# Patient Record
Sex: Female | Born: 1985 | Race: White | Hispanic: No | Marital: Married | Smoking: Never smoker
Health system: Southern US, Community
[De-identification: ages and names within clinical notes are randomized; demographics above are authoritative.]

## PROBLEM LIST (undated history)

## (undated) DIAGNOSIS — R112 Nausea with vomiting, unspecified: Secondary | ICD-10-CM

## (undated) DIAGNOSIS — R51 Headache: Secondary | ICD-10-CM

## (undated) DIAGNOSIS — R519 Headache, unspecified: Secondary | ICD-10-CM

## (undated) DIAGNOSIS — T7840XA Allergy, unspecified, initial encounter: Secondary | ICD-10-CM

## (undated) DIAGNOSIS — R002 Palpitations: Secondary | ICD-10-CM

## (undated) DIAGNOSIS — Z9889 Other specified postprocedural states: Secondary | ICD-10-CM

## (undated) DIAGNOSIS — C801 Malignant (primary) neoplasm, unspecified: Secondary | ICD-10-CM

## (undated) DIAGNOSIS — E041 Nontoxic single thyroid nodule: Secondary | ICD-10-CM

## (undated) HISTORY — DX: Nausea with vomiting, unspecified: Z98.890

## (undated) HISTORY — PX: OTHER SURGICAL HISTORY: SHX169

## (undated) HISTORY — DX: Malignant (primary) neoplasm, unspecified: C80.1

## (undated) HISTORY — PX: WISDOM TOOTH EXTRACTION: SHX21

## (undated) HISTORY — DX: Nausea with vomiting, unspecified: R11.2

## (undated) HISTORY — DX: Allergy, unspecified, initial encounter: T78.40XA

---

## 2014-05-05 NOTE — L&D Delivery Note (Signed)
Delivery Note At 3:31 PM a viable female was delivered via Vaginal, Spontaneous Delivery (Presentation: Right Occiput Anterior).  APGAR:8/9 , ; weight  .   Placenta status: Intact, Spontaneous.  Cord: 3 vessels with the following complications: None.  Cord pH: na  Anesthesia: Local  Episiotomy:second degree   Lacerations:  none Suture Repair: 2.0 chromic Est. Blood Loss (mL): 400    Mom to postpartum.  Baby to Couplet care / Skin to Skin.  Eman Morimoto S 01/18/2015, 3:52 PM

## 2014-05-17 ENCOUNTER — Other Ambulatory Visit (HOSPITAL_COMMUNITY): Payer: Self-pay | Admitting: Obstetrics and Gynecology

## 2014-05-17 DIAGNOSIS — E041 Nontoxic single thyroid nodule: Secondary | ICD-10-CM

## 2014-05-22 ENCOUNTER — Ambulatory Visit (HOSPITAL_COMMUNITY): Payer: Self-pay

## 2014-06-12 LAB — OB RESULTS CONSOLE ANTIBODY SCREEN: Antibody Screen: NEGATIVE

## 2014-06-12 LAB — OB RESULTS CONSOLE ABO/RH: RH TYPE: POSITIVE

## 2014-06-12 LAB — OB RESULTS CONSOLE HEPATITIS B SURFACE ANTIGEN: Hepatitis B Surface Ag: NEGATIVE

## 2014-06-12 LAB — OB RESULTS CONSOLE RUBELLA ANTIBODY, IGM: Rubella: IMMUNE

## 2014-06-12 LAB — OB RESULTS CONSOLE GC/CHLAMYDIA
Chlamydia: NEGATIVE
GC PROBE AMP, GENITAL: NEGATIVE

## 2014-06-12 LAB — OB RESULTS CONSOLE RPR: RPR: NONREACTIVE

## 2014-06-12 LAB — OB RESULTS CONSOLE HIV ANTIBODY (ROUTINE TESTING): HIV: NONREACTIVE

## 2014-07-11 ENCOUNTER — Other Ambulatory Visit (INDEPENDENT_AMBULATORY_CARE_PROVIDER_SITE_OTHER): Payer: Self-pay | Admitting: *Deleted

## 2014-07-11 DIAGNOSIS — E041 Nontoxic single thyroid nodule: Secondary | ICD-10-CM

## 2014-08-22 LAB — US OB FOLLOW UP

## 2014-08-23 ENCOUNTER — Other Ambulatory Visit: Payer: Self-pay | Admitting: Surgery

## 2014-08-23 DIAGNOSIS — E041 Nontoxic single thyroid nodule: Secondary | ICD-10-CM

## 2014-08-24 ENCOUNTER — Other Ambulatory Visit: Payer: Self-pay | Admitting: Surgery

## 2014-08-24 ENCOUNTER — Other Ambulatory Visit (HOSPITAL_COMMUNITY): Payer: Self-pay | Admitting: Obstetrics and Gynecology

## 2014-08-24 DIAGNOSIS — E041 Nontoxic single thyroid nodule: Secondary | ICD-10-CM

## 2014-08-24 DIAGNOSIS — O43122 Velamentous insertion of umbilical cord, second trimester: Secondary | ICD-10-CM

## 2014-08-24 DIAGNOSIS — Z3689 Encounter for other specified antenatal screening: Secondary | ICD-10-CM

## 2014-08-24 DIAGNOSIS — Z3A20 20 weeks gestation of pregnancy: Secondary | ICD-10-CM

## 2014-09-01 ENCOUNTER — Other Ambulatory Visit (HOSPITAL_COMMUNITY): Payer: BLUE CROSS/BLUE SHIELD

## 2014-09-01 ENCOUNTER — Encounter (HOSPITAL_COMMUNITY): Payer: Self-pay

## 2014-09-01 ENCOUNTER — Ambulatory Visit (HOSPITAL_COMMUNITY)
Admission: RE | Admit: 2014-09-01 | Discharge: 2014-09-01 | Disposition: A | Discharge status: Home / Self Care | Payer: BLUE CROSS/BLUE SHIELD | Source: Ambulatory Visit | Attending: Obstetrics and Gynecology | Admitting: Obstetrics and Gynecology

## 2014-09-01 DIAGNOSIS — Z3A20 20 weeks gestation of pregnancy: Secondary | ICD-10-CM | POA: Insufficient documentation

## 2014-09-01 DIAGNOSIS — IMO0002 Reserved for concepts with insufficient information to code with codable children: Secondary | ICD-10-CM | POA: Insufficient documentation

## 2014-09-01 DIAGNOSIS — Z3689 Encounter for other specified antenatal screening: Secondary | ICD-10-CM

## 2014-09-01 DIAGNOSIS — Z36 Encounter for antenatal screening of mother: Secondary | ICD-10-CM | POA: Diagnosis not present

## 2014-09-01 DIAGNOSIS — O43122 Velamentous insertion of umbilical cord, second trimester: Secondary | ICD-10-CM | POA: Insufficient documentation

## 2014-09-04 ENCOUNTER — Encounter (HOSPITAL_COMMUNITY): Payer: Self-pay | Admitting: Obstetrics and Gynecology

## 2014-09-04 ENCOUNTER — Other Ambulatory Visit (HOSPITAL_COMMUNITY): Payer: Self-pay | Admitting: Obstetrics and Gynecology

## 2014-11-14 ENCOUNTER — Other Ambulatory Visit: Payer: BLUE CROSS/BLUE SHIELD

## 2014-11-15 ENCOUNTER — Other Ambulatory Visit: Payer: Self-pay | Admitting: Surgery

## 2014-11-15 ENCOUNTER — Ambulatory Visit
Admission: RE | Admit: 2014-11-15 | Discharge: 2014-11-15 | Disposition: A | Discharge status: Home / Self Care | Payer: BLUE CROSS/BLUE SHIELD | Source: Ambulatory Visit | Attending: Surgery | Admitting: Surgery

## 2014-11-15 DIAGNOSIS — E041 Nontoxic single thyroid nodule: Secondary | ICD-10-CM

## 2015-01-18 ENCOUNTER — Inpatient Hospital Stay (HOSPITAL_COMMUNITY)
Admission: AD | Admit: 2015-01-18 | Discharge: 2015-01-20 | DRG: 775 | Disposition: A | Discharge status: Home / Self Care | Payer: BC Managed Care – PPO | Source: Ambulatory Visit | Attending: Obstetrics and Gynecology | Admitting: Obstetrics and Gynecology

## 2015-01-18 ENCOUNTER — Encounter (HOSPITAL_COMMUNITY): Payer: Self-pay | Admitting: *Deleted

## 2015-01-18 DIAGNOSIS — E041 Nontoxic single thyroid nodule: Secondary | ICD-10-CM | POA: Diagnosis present

## 2015-01-18 DIAGNOSIS — Z3A40 40 weeks gestation of pregnancy: Secondary | ICD-10-CM | POA: Diagnosis present

## 2015-01-18 DIAGNOSIS — O99284 Endocrine, nutritional and metabolic diseases complicating childbirth: Secondary | ICD-10-CM | POA: Diagnosis present

## 2015-01-18 DIAGNOSIS — O480 Post-term pregnancy: Secondary | ICD-10-CM | POA: Diagnosis present

## 2015-01-18 DIAGNOSIS — Z3689 Encounter for other specified antenatal screening: Secondary | ICD-10-CM

## 2015-01-18 DIAGNOSIS — O43123 Velamentous insertion of umbilical cord, third trimester: Secondary | ICD-10-CM | POA: Diagnosis present

## 2015-01-18 DIAGNOSIS — Z3490 Encounter for supervision of normal pregnancy, unspecified, unspecified trimester: Secondary | ICD-10-CM

## 2015-01-18 DIAGNOSIS — IMO0002 Reserved for concepts with insufficient information to code with codable children: Secondary | ICD-10-CM

## 2015-01-18 DIAGNOSIS — Z3A20 20 weeks gestation of pregnancy: Secondary | ICD-10-CM

## 2015-01-18 LAB — CBC
HEMATOCRIT: 34 % — AB (ref 36.0–46.0)
HEMOGLOBIN: 11.7 g/dL — AB (ref 12.0–15.0)
MCH: 30.9 pg (ref 26.0–34.0)
MCHC: 34.4 g/dL (ref 30.0–36.0)
MCV: 89.7 fL (ref 78.0–100.0)
Platelets: 207 10*3/uL (ref 150–400)
RBC: 3.79 MIL/uL — ABNORMAL LOW (ref 3.87–5.11)
RDW: 14.4 % (ref 11.5–15.5)
WBC: 11.3 10*3/uL — AB (ref 4.0–10.5)

## 2015-01-18 LAB — ABO/RH: ABO/RH(D): O POS

## 2015-01-18 LAB — TYPE AND SCREEN
ABO/RH(D): O POS
ANTIBODY SCREEN: NEGATIVE

## 2015-01-18 LAB — RPR: RPR Ser Ql: NONREACTIVE

## 2015-01-18 MED ORDER — ACETAMINOPHEN 325 MG PO TABS: 650.0000 mg | ORAL_TABLET | ORAL | Status: DC | PRN

## 2015-01-18 MED ORDER — BENZOCAINE-MENTHOL 20-0.5 % EX AERO
1.0000 "application " | INHALATION_SPRAY | CUTANEOUS | Status: DC | PRN
  Administered 2015-01-18: 1 via TOPICAL
  Filled 2015-01-18: qty 56

## 2015-01-18 MED ORDER — FENTANYL 2.5 MCG/ML BUPIVACAINE 1/10 % EPIDURAL INFUSION (WH - ANES)
14.0000 mL/h | INTRAMUSCULAR | Status: DC | PRN
Start: 2015-01-18 — End: 2015-01-18

## 2015-01-18 MED ORDER — LANOLIN HYDROUS EX OINT: TOPICAL_OINTMENT | CUTANEOUS | Status: DC | PRN

## 2015-01-18 MED ORDER — TETANUS-DIPHTH-ACELL PERTUSSIS 5-2.5-18.5 LF-MCG/0.5 IM SUSP: 0.5000 mL | Freq: Once | INTRAMUSCULAR | Status: DC

## 2015-01-18 MED ORDER — OXYCODONE-ACETAMINOPHEN 5-325 MG PO TABS
2.0000 | ORAL_TABLET | ORAL | Status: DC | PRN
Start: 2015-01-18 — End: 2015-01-18

## 2015-01-18 MED ORDER — OXYCODONE-ACETAMINOPHEN 5-325 MG PO TABS: 1.0000 | ORAL_TABLET | ORAL | Status: DC | PRN

## 2015-01-18 MED ORDER — DIPHENHYDRAMINE HCL 50 MG/ML IJ SOLN: 12.5000 mg | INTRAMUSCULAR | Status: DC | PRN

## 2015-01-18 MED ORDER — ZOLPIDEM TARTRATE 5 MG PO TABS: 5.0000 mg | ORAL_TABLET | Freq: Every evening | ORAL | Status: DC | PRN

## 2015-01-18 MED ORDER — ONDANSETRON HCL 4 MG/2ML IJ SOLN: 4.0000 mg | INTRAMUSCULAR | Status: DC | PRN

## 2015-01-18 MED ORDER — BISACODYL 10 MG RE SUPP: 10.0000 mg | Freq: Every day | RECTAL | Status: DC | PRN

## 2015-01-18 MED ORDER — DIPHENHYDRAMINE HCL 25 MG PO CAPS: 25.0000 mg | ORAL_CAPSULE | Freq: Four times a day (QID) | ORAL | Status: DC | PRN

## 2015-01-18 MED ORDER — DIBUCAINE 1 % RE OINT: 1.0000 "application " | TOPICAL_OINTMENT | RECTAL | Status: DC | PRN

## 2015-01-18 MED ORDER — FLEET ENEMA 7-19 GM/118ML RE ENEM: 1.0000 | ENEMA | Freq: Every day | RECTAL | Status: DC | PRN

## 2015-01-18 MED ORDER — LIDOCAINE HCL (PF) 1 % IJ SOLN
30.0000 mL | INTRAMUSCULAR | Status: AC | PRN
  Administered 2015-01-18: 30 mL via SUBCUTANEOUS
  Filled 2015-01-18: qty 30

## 2015-01-18 MED ORDER — WITCH HAZEL-GLYCERIN EX PADS: 1.0000 "application " | MEDICATED_PAD | CUTANEOUS | Status: DC | PRN

## 2015-01-18 MED ORDER — SIMETHICONE 80 MG PO CHEW: 80.0000 mg | CHEWABLE_TABLET | ORAL | Status: DC | PRN

## 2015-01-18 MED ORDER — OXYCODONE-ACETAMINOPHEN 5-325 MG PO TABS: 2.0000 | ORAL_TABLET | ORAL | Status: DC | PRN

## 2015-01-18 MED ORDER — OXYTOCIN 40 UNITS IN LACTATED RINGERS INFUSION - SIMPLE MED
62.5000 mL/h | INTRAVENOUS | Status: DC
  Filled 2015-01-18: qty 1000

## 2015-01-18 MED ORDER — LACTATED RINGERS IV SOLN: 500.0000 mL | INTRAVENOUS | Status: DC | PRN

## 2015-01-18 MED ORDER — EPHEDRINE 5 MG/ML INJ
10.0000 mg | INTRAVENOUS | Status: DC | PRN
Start: 2015-01-18 — End: 2015-01-18
  Filled 2015-01-18: qty 2

## 2015-01-18 MED ORDER — SENNOSIDES-DOCUSATE SODIUM 8.6-50 MG PO TABS
2.0000 | ORAL_TABLET | ORAL | Status: DC
  Administered 2015-01-18 – 2015-01-20 (×2): 2 via ORAL
  Filled 2015-01-18 (×2): qty 2

## 2015-01-18 MED ORDER — IBUPROFEN 600 MG PO TABS
600.0000 mg | ORAL_TABLET | Freq: Four times a day (QID) | ORAL | Status: DC
  Administered 2015-01-18 – 2015-01-20 (×8): 600 mg via ORAL
  Filled 2015-01-18 (×8): qty 1

## 2015-01-18 MED ORDER — CITRIC ACID-SODIUM CITRATE 334-500 MG/5ML PO SOLN: 30.0000 mL | ORAL | Status: DC | PRN

## 2015-01-18 MED ORDER — FLEET ENEMA 7-19 GM/118ML RE ENEM: 1.0000 | ENEMA | RECTAL | Status: DC | PRN

## 2015-01-18 MED ORDER — LACTATED RINGERS IV SOLN: INTRAVENOUS | Status: DC

## 2015-01-18 MED ORDER — PRENATAL MULTIVITAMIN CH
1.0000 | ORAL_TABLET | Freq: Every day | ORAL | Status: DC
  Administered 2015-01-19 – 2015-01-20 (×2): 1 via ORAL
  Filled 2015-01-18 (×2): qty 1

## 2015-01-18 MED ORDER — ONDANSETRON HCL 4 MG/2ML IJ SOLN: 4.0000 mg | Freq: Four times a day (QID) | INTRAMUSCULAR | Status: DC | PRN

## 2015-01-18 MED ORDER — OXYTOCIN BOLUS FROM INFUSION
500.0000 mL | INTRAVENOUS | Status: DC
  Administered 2015-01-18: 500 mL via INTRAVENOUS

## 2015-01-18 MED ORDER — PHENYLEPHRINE 40 MCG/ML (10ML) SYRINGE FOR IV PUSH (FOR BLOOD PRESSURE SUPPORT)
80.0000 ug | PREFILLED_SYRINGE | INTRAVENOUS | Status: DC | PRN
  Filled 2015-01-18: qty 2

## 2015-01-18 MED ORDER — ONDANSETRON HCL 4 MG PO TABS: 4.0000 mg | ORAL_TABLET | ORAL | Status: DC | PRN

## 2015-01-18 NOTE — Progress Notes (Signed)
Patient ID: Crystal Humphrey, female   DOB: 03/02/1986, 29 y.o.   MRN: [id number] Did have a  Velamentous insertion of the cord   Sent to path

## 2015-01-18 NOTE — Progress Notes (Signed)
Pt to RM 164

## 2015-01-18 NOTE — MAU Note (Signed)
Pt reports contractions and some bleeding.

## 2015-01-18 NOTE — H&P (Signed)
Ethelene Schindel is a 29 y.o. female presenting AT [place].  NEG GBS. Maternal Medical History:  Reason for admission: Contractions.   Contractions: Onset was 3-5 hours ago.   Frequency: regular.   Perceived severity is moderate.    Fetal activity: Perceived fetal activity is normal.    Prenatal complications: THYROID NODULE AND VELAMENTOUS INSERTION OF CORD  Prenatal Complications - Diabetes: none.    OB History    Gravida Para Term Preterm AB TAB SAB Ectopic Multiple Living   1              Past Medical History  Diagnosis Date  . Medical history non-contributory    Past Surgical History  Procedure Laterality Date  . Biopsy on thyroid     Family History: family history is not on file. Social History:  reports that she has never smoked. She does not have any smokeless tobacco history on file. She reports that she does not drink alcohol or use illicit drugs.   Prenatal Transfer Tool  Maternal Diabetes: No Genetic Screening: Normal Maternal Ultrasounds/Referrals: Normal Fetal Ultrasounds or other Referrals:  None Maternal Substance Abuse:  No Significant Maternal Medications:  None Significant Maternal Lab Results:  None Other Comments:  None  ROS  Dilation: 4 Effacement (%): 90 Station: -1 Exam by:: soliz rn  Blood pressure 128/84, pulse 57, temperature 98.2 F (36.8 C), temperature source Oral, resp. rate 18, height 5\' 5"  (1.651 m), weight 174 lb (78.926 kg), SpO2 100 %. Maternal Exam:  Uterine Assessment: Contraction strength is firm.  Contraction frequency is regular.   Abdomen: Estimated fetal weight is 7+.   Fetal presentation: vertex  Pelvis: adequate for delivery.   Cervix: 5 CM AND 90 %  Fetal Exam Fetal State Assessment: Category I - tracings are normal.     Physical Exam  Prenatal labs: ABO, Rh: O/Positive/-- (02/08 0000) Antibody: Negative (02/08 0000) Rubella: Immune (02/08 0000) RPR: Nonreactive (02/08 0000)  HBsAg:  Negative (02/08 0000)  HIV: Non-reactive (02/08 0000)  GBS:     Assessment/Plan: IUP AT TERM WITH SOL ROUTINE L AND  D   Helmut Hennon S 01/18/2015, 8:32 AM

## 2015-01-18 NOTE — Progress Notes (Signed)
Notified of pt arrival in MAU and vaginal exam. Will let patient walk and recheck cervix after 1 hour

## 2015-01-19 LAB — CBC
HEMATOCRIT: 32.6 % — AB (ref 36.0–46.0)
Hemoglobin: 11 g/dL — ABNORMAL LOW (ref 12.0–15.0)
MCH: 30.5 pg (ref 26.0–34.0)
MCHC: 33.7 g/dL (ref 30.0–36.0)
MCV: 90.3 fL (ref 78.0–100.0)
Platelets: 215 10*3/uL (ref 150–400)
RBC: 3.61 MIL/uL — ABNORMAL LOW (ref 3.87–5.11)
RDW: 14.7 % (ref 11.5–15.5)
WBC: 15.9 10*3/uL — ABNORMAL HIGH (ref 4.0–10.5)

## 2015-01-19 NOTE — Progress Notes (Signed)
Post Partum Day 1 Subjective: no complaints, up ad lib, voiding, tolerating PO and + flatus  Objective: Blood pressure 132/87, pulse 86, temperature 98.4 F (36.9 C), temperature source Oral, resp. rate 20, height 5\' 5"  (1.651 m), weight 174 lb (78.926 kg), SpO2 100 %, unknown if currently breastfeeding.  Physical Exam:  General: alert and cooperative Lochia: appropriate Uterine Fundus: firm Incision: healing well DVT Evaluation: No evidence of DVT seen on physical exam. Negative Homan's sign. No cords or calf tenderness. No significant calf/ankle edema.   Recent Labs  01/18/15 0735 01/19/15 0545  HGB 11.7* 11.0*  HCT 34.0* 32.6*    Assessment/Plan: Plan for discharge tomorrow and Circumcision prior to discharge   LOS: 1 day   CURTIS,CAROL G 01/19/2015, 8:07 AM

## 2015-01-19 NOTE — Lactation Note (Signed)
This note was copied from the chart of [place]. Lactation Consultation Note New mom stated baby has had 2 good BF, but hasn't been interested recently. Has been spitting.  Educated about newborn behavior, I&O, supply and demand. Mom encouraged to do skin-to-skin. Mom encouraged to feed baby 8-12 times/24 hours and with feeding cues. Mom encouraged to waken baby for feeds. Hand expression taught to Mom w/noted colostrum. Referred to Baby and Me Book in Breastfeeding section Pg. 22-23 for position options and Proper latch demonstration. Mom has small breast feel slightly heavy. Everted nipples w/short shaft, able to obtain a deep latch. Baby has recessed chin and will need chin tug, mom demonstrated after taught. Explained baby being spitty and not hungry, encouraged to keep doing STS and trying to BF.  [place] brochure given w/resources, support groups and [place] services. Patient Name: Crystal Humphrey NOMVE'H Date: 01/19/2015 Reason for consult: Initial assessment   Maternal Data Has patient been taught Hand Expression?: Yes Does the patient have breastfeeding experience prior to this delivery?: No  Feeding Feeding Type: Breast Fed Length of feed: 0 min  LATCH Score/Interventions Latch: Too sleepy or reluctant, no latch achieved, no sucking elicited. Intervention(s): Skin to skin;Teach feeding cues;Waking techniques Intervention(s): Adjust position;Assist with latch;Breast massage;Breast compression  Audible Swallowing: None Intervention(s): Skin to skin;Hand expression  Type of Nipple: Everted at rest and after stimulation (short shaft)  Comfort (Breast/Nipple): Soft / non-tender     Hold (Positioning): Assistance needed to correctly position infant at breast and maintain latch. Intervention(s): Skin to skin;Position options;Support Pillows;Breastfeeding basics reviewed  LATCH Score: 5  Lactation Tools Discussed/Used WIC Program: No   Consult Status Consult  Status: Follow-up Date: 01/19/15 Follow-up type: In-patient    Theodoro Kalata 01/19/2015, 4:51 AM

## 2015-01-19 NOTE — Lactation Note (Addendum)
This note was copied from the chart of [place]. Lactation Consultation Note  Assisted mom with deeper latch.  Many swallows observed.  Mom reported increased comfort and less dimpling.  Parents reported that he was much more content when he was detached from the first breast than in previous feedings. Mom has small breasts but glandular tissue is palpable.  Follow-up tomorrow.  Patient Name: Crystal Humphrey BHALP'F Date: 01/19/2015     Maternal Data    Feeding Feeding Type: Breast Fed Length of feed: 35 min  LATCH Score/Interventions Latch: Grasps breast easily, tongue down, lips flanged, rhythmical sucking.  Audible Swallowing: A few with stimulation  Type of Nipple: Everted at rest and after stimulation  Comfort (Breast/Nipple): Soft / non-tender     Hold (Positioning): No assistance needed to correctly position infant at breast.  LATCH Score: 9  Lactation Tools Discussed/Used     Consult Status Consult Status: Follow-up Date: 01/20/15 Follow-up type: In-patient    Van Clines 01/19/2015, 11:03 PM

## 2015-01-20 NOTE — Lactation Note (Signed)
This note was copied from the chart of [place]. Lactation Consultation Note; Mom has baby latched to the breast when I was called to room. Nursing in cross cradle position. Baby has bottom lip tucked under, adjusted bottom lip and mom reports that feels a little better. Reviewed with dad how to untuck bottom lip. No further questions at present. To call prn  Patient Name: Crystal Humphrey KGURK'Y Date: 01/20/2015 Reason for consult: Follow-up assessment   Maternal Data Formula Feeding for Exclusion: No Does the patient have breastfeeding experience prior to this delivery?: No  Feeding Feeding Type: Breast Fed Length of feed: 10 min  LATCH Score/Interventions Latch: Grasps breast easily, tongue down, lips flanged, rhythmical sucking.  Audible Swallowing: A few with stimulation  Type of Nipple: Everted at rest and after stimulation  Comfort (Breast/Nipple): Filling, red/small blisters or bruises, mild/mod discomfort  Problem noted: Mild/Moderate discomfort Interventions (Mild/moderate discomfort): Comfort gels;Hand expression  Hold (Positioning): No assistance needed to correctly position infant at breast. Intervention(s): Breastfeeding basics reviewed  LATCH Score: 8  Lactation Tools Discussed/Used WIC Program: No Pump Review: Setup, frequency, and cleaning Initiated by:: DW Date initiated:: 01/20/15   Consult Status Consult Status: Complete Date: 01/20/15 Follow-up type: In-patient    Truddie Crumble 01/20/2015, 12:55 PM

## 2015-01-20 NOTE — Lactation Note (Signed)
This note was copied from the chart of [place]. Lactation Consultation Note  Follow up with mom at request of Dr. [place] and prior to D/C. Mom reports that infant was sleepy yesterday but cluster fed during the night. She reports that she does have difficulty with getting him latched at times and has to stimulate to keep awake at breast. Mom reported that there is pain throughout the feeds at times but she just leaves him as she is glad he is eating. Explained that pain during feeds generally indicates improper latch and the importance to relatch as necessary for deep latch to decrease trauma to nipple tissue and maximize milk transfer. Baby is out of the room for circumcision, left mom LC number to call with next feeding. Baby has Ped. appt tomorrow. Mom is able to get a DEBP from her insurance company and plans to order that when home. Will send home with hand pump. Mom is able to hand express milk. Told to call for next feeding for assessment.  Patient Name: Boy Takyra Cantrall ASTMH'D Date: 01/20/2015 Reason for consult: Follow-up assessment   Maternal Data    Feeding    LATCH Score/Interventions                      Lactation Tools Discussed/Used     Consult Status Consult Status: Follow-up Date: 01/20/15 Follow-up type: In-patient    Debby Freiberg Hice 01/20/2015, 9:11 AM

## 2015-01-20 NOTE — Lactation Note (Signed)
This note was copied from the chart of [place]. Lactation Consultation Note  Baby just back from circ. Awake and rooting. Assisted mom with latch in football position  Reviewed wide open mouth and keeping the baby close to the breast throughout the feeding. Encouraged to hold breast for good support. She is letting go of the breast and baby sliding to the tip. Comfort gels given with instructions for use. Plans to get pump from insurance co. Manual pump given- reviewed engorgement prevention and treatment. No further questions at present. Reviewed BFSG and OP appointments as resources for support after DC. To call prn  Patient Name: Crystal Humphrey DHWYS'H Date: 01/20/2015 Reason for consult: Follow-up assessment   Maternal Data Formula Feeding for Exclusion: No Does the patient have breastfeeding experience prior to this delivery?: No  Feeding Feeding Type: Breast Fed Length of feed: 10 min  LATCH Score/Interventions Latch: Grasps breast easily, tongue down, lips flanged, rhythmical sucking.  Audible Swallowing: A few with stimulation  Type of Nipple: Everted at rest and after stimulation  Comfort (Breast/Nipple): Soft / non-tender     Hold (Positioning): Assistance needed to correctly position infant at breast and maintain latch. Intervention(s): Breastfeeding basics reviewed  LATCH Score: 8  Lactation Tools Discussed/Used WIC Program: No Pump Review: Setup, frequency, and cleaning Initiated by:: DW Date initiated:: 01/20/15   Consult Status Consult Status: Complete Date: 01/20/15 Follow-up type: In-patient    Truddie Crumble 01/20/2015, 11:52 AM

## 2015-01-20 NOTE — Discharge Summary (Signed)
Obstetric Discharge Summary Reason for Admission: onset of labor Prenatal Procedures: none Intrapartum Procedures: spontaneous vaginal delivery Postpartum Procedures: none Complications-Operative and Postpartum: 2nd degree perineal laceration HEMOGLOBIN  Date Value Ref Range Status  01/19/2015 11.0* 12.0 - 15.0 g/dL Final   HCT  Date Value Ref Range Status  01/19/2015 32.6* 36.0 - 46.0 % Final    Physical Exam:  General: alert, cooperative and appears stated age 29: appropriate Uterine Fundus: firm Incision: healing well, no significant drainage, no dehiscence DVT Evaluation: No evidence of DVT seen on physical exam.  Discharge Diagnoses: Term Pregnancy-delivered  Discharge Information: Date: 01/20/2015 Activity: pelvic rest Diet: routine Medications: None Condition: stable Instructions: refer to practice specific booklet Discharge to: home   Newborn Data: Live born female  Birth Weight: 6 lb 11.6 oz (3050 g) APGAR: 9, 9  Home with mother.  Crystal Humphrey 01/20/2015, 8:01 AM

## 2015-01-23 ENCOUNTER — Inpatient Hospital Stay (HOSPITAL_COMMUNITY): Payer: BLUE CROSS/BLUE SHIELD

## 2015-12-18 ENCOUNTER — Other Ambulatory Visit: Payer: Self-pay | Admitting: Surgery

## 2015-12-18 DIAGNOSIS — E041 Nontoxic single thyroid nodule: Secondary | ICD-10-CM

## 2015-12-19 ENCOUNTER — Ambulatory Visit
Admission: RE | Admit: 2015-12-19 | Discharge: 2015-12-19 | Disposition: A | Discharge status: Home / Self Care | Payer: BC Managed Care – PPO | Source: Ambulatory Visit | Attending: Surgery | Admitting: Surgery

## 2015-12-19 DIAGNOSIS — E041 Nontoxic single thyroid nodule: Secondary | ICD-10-CM

## 2015-12-21 ENCOUNTER — Ambulatory Visit: Payer: Self-pay | Admitting: Surgery

## 2016-02-28 NOTE — Patient Instructions (Addendum)
Crystal Humphrey  02/28/2016   Your procedure is scheduled on: 03-07-16  Report to [facility] Main  Entrance take [facility]  elevators to 3rd floor to  [place] at 715 AM.  Call this number if you have problems the morning of surgery [phone]   Remember: ONLY 1 PERSON MAY GO WITH YOU TO SHORT STAY TO GET  READY MORNING OF [place].  Do not eat food or drink liquids :After Midnight.     Take these medicines the morning of surgery with A SIP OF WATER:NONE              You may not have any metal on your body including hair pins and              piercings  Do not wear jewelry, make-up, lotions, powders or perfumes, deodorant             Do not wear nail polish.  Do not shave  48 hours prior to surgery.              Men may shave face and neck.   Do not bring valuables to the hospital. [place].  Contacts, dentures or bridgework may not be worn into surgery.  Leave suitcase in the car. After surgery it may be brought to your room.     Patients discharged the day of surgery will not be allowed to drive home.  Name and phone number of your driver: Crystal Humphrey CELL [phone]  Special Instructions: N/A              Please read over the following fact sheets you were given: _____________________________________________________________________             [facility] - Preparing for Surgery Before surgery, you can play an important role.  Because skin is not sterile, your skin needs to be as free of germs as possible.  You can reduce the number of germs on your skin by washing with CHG (chlorahexidine gluconate) soap before surgery.  CHG is an antiseptic cleaner which kills germs and bonds with the skin to continue killing germs even after washing. Please DO NOT use if you have an allergy to CHG or antibacterial soaps.  If your skin becomes reddened/irritated stop using the CHG and inform your nurse  when you arrive at Short Stay. Do not shave (including legs and underarms) for at least 48 hours prior to the first CHG shower.  You may shave your face/neck. Please follow these instructions carefully:  1.  Shower with CHG Soap the night before surgery and the  morning of Surgery.  2.  If you choose to wash your hair, wash your hair first as usual with your  normal  shampoo.  3.  After you shampoo, rinse your hair and body thoroughly to remove the  shampoo.                           4.  Use CHG as you would any other liquid soap.  You can apply chg directly  to the skin and wash                       Gently with a scrungie or  clean washcloth.  5.  Apply the CHG Soap to your body ONLY FROM THE NECK DOWN.   Do not use on face/ open                           Wound or open sores. Avoid contact with eyes, ears mouth and genitals (private parts).                       Wash face,  Genitals (private parts) with your normal soap.             6.  Wash thoroughly, paying special attention to the area where your surgery  will be performed.  7.  Thoroughly rinse your body with warm water from the neck down.  8.  DO NOT shower/wash with your normal soap after using and rinsing off  the CHG Soap.                9.  Pat yourself dry with a clean towel.            10.  Wear clean pajamas.            11.  Place clean sheets on your bed the night of your first shower and do not  sleep with pets. Day of Surgery : Do not apply any lotions/deodorants the morning of surgery.  Please wear clean clothes to the hospital/surgery center.  FAILURE TO FOLLOW THESE INSTRUCTIONS MAY RESULT IN THE CANCELLATION OF YOUR SURGERY PATIENT SIGNATURE_________________________________  NURSE SIGNATURE__________________________________  ________________________________________________________________________

## 2016-02-29 ENCOUNTER — Ambulatory Visit (HOSPITAL_COMMUNITY)
Admission: RE | Admit: 2016-02-29 | Discharge: 2016-02-29 | Disposition: A | Discharge status: Home / Self Care | Payer: BC Managed Care – PPO | Source: Ambulatory Visit | Attending: Anesthesiology | Admitting: Anesthesiology

## 2016-02-29 ENCOUNTER — Encounter (HOSPITAL_COMMUNITY): Payer: Self-pay | Admitting: *Deleted

## 2016-02-29 ENCOUNTER — Encounter (HOSPITAL_COMMUNITY)
Admission: RE | Admit: 2016-02-29 | Discharge: 2016-02-29 | Disposition: A | Discharge status: Home / Self Care | Payer: BC Managed Care – PPO | Source: Ambulatory Visit | Attending: Surgery | Admitting: Surgery

## 2016-02-29 DIAGNOSIS — Z01818 Encounter for other preprocedural examination: Secondary | ICD-10-CM

## 2016-02-29 DIAGNOSIS — Z01812 Encounter for preprocedural laboratory examination: Secondary | ICD-10-CM | POA: Insufficient documentation

## 2016-02-29 DIAGNOSIS — E041 Nontoxic single thyroid nodule: Secondary | ICD-10-CM | POA: Insufficient documentation

## 2016-02-29 DIAGNOSIS — Z01810 Encounter for preprocedural cardiovascular examination: Secondary | ICD-10-CM | POA: Diagnosis present

## 2016-02-29 HISTORY — DX: Nontoxic single thyroid nodule: E04.1

## 2016-02-29 LAB — CBC
HEMATOCRIT: 39.2 % (ref 36.0–46.0)
HEMOGLOBIN: 13.1 g/dL (ref 12.0–15.0)
MCH: 29.9 pg (ref 26.0–34.0)
MCHC: 33.4 g/dL (ref 30.0–36.0)
MCV: 89.5 fL (ref 78.0–100.0)
Platelets: 258 10*3/uL (ref 150–400)
RBC: 4.38 MIL/uL (ref 3.87–5.11)
RDW: 12.5 % (ref 11.5–15.5)
WBC: 5.2 10*3/uL (ref 4.0–10.5)

## 2016-02-29 LAB — HCG, SERUM, QUALITATIVE: Preg, Serum: NEGATIVE

## 2016-03-06 ENCOUNTER — Encounter (HOSPITAL_COMMUNITY): Payer: Self-pay | Admitting: Surgery

## 2016-03-06 DIAGNOSIS — E041 Nontoxic single thyroid nodule: Secondary | ICD-10-CM | POA: Diagnosis present

## 2016-03-06 NOTE — H&P (Signed)
General Surgery [facility]  Crystal Humphrey DOB: 09-20-1985 Married / Language: English / Race: White Female  History of Present Illness  Patient words: thyroid.  The patient is a 30 year old female who presents with a thyroid nodule.  Patient returns for follow-up of thyroid nodule involving the thyroid isthmus. This nodule has been followed with sequential ultrasound scanning and has waxed and waned in size. Compared to one year ago, there has been mild enlargement in the nodule now measures 1.9 x 0.8 x 1.7 cm. It is mixed cystic and solid in composition and is wider than tall in shape. Biopsy has been recommended by the radiologist. Patient denies any new findings in the neck.   Allergies  No Known Drug Allergies03/11/2014  Medication History ZyrTEC Allergy (10MG  Capsule, Oral) Active. Multivitamin Adult (Oral) Active. Medications Reconciled  Vitals Weight: 135.6 lb Height: 65in Body Surface Area: 1.68 m Body Mass Index: 22.56 kg/m  Temp.: 98.61F(Oral)  Pulse: 68 (Regular)  BP: 104/62 (Sitting, Left Arm, Standard)  Physical Exam The physical exam findings are as follows: Note:General - appears comfortable, no distress; not diaphorectic  HEENT - normocephalic; sclerae clear, gaze conjugate; mucous membranes moist, dentition good; voice normal  Neck - symmetric on extension; no palpable anterior or posterior cervical adenopathy; in the midline overlying the trachea is a soft tissue mass measuring at least 2 cm in diameter, slightly firm, mobile, discrete, nontender; left and right thyroid lobes are without palpable abnormality  Chest - clear bilaterally without rhonchi, rales, or wheeze  Cor - regular rhythm with normal rate; no significant murmur  Ext - non-tender without significant edema or lymphedema  Neuro - grossly intact; no tremor   Assessment & Plan  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 Remainder of the thyroid appears normal on ultrasound examination and clinical examination. The dominant nodule has increased in size on sequential ultrasound scanning. Biopsy has been recommended by the radiologist.  Patient is a good candidate for isthmusectomy. This would remove the entire nodule and allow for definitive pathologic evaluation. The remainder of the thyroid appears grossly normal. I think this could be done as an outpatient surgical procedure.  Risk and benefits of the procedure discussed with patient. We discussed time out of work. We discussed the location of the surgical incision. We discussed the possibility of additional surgery depending on the final diagnosis. Patient and her family understand and wish to proceed with surgery in the near future.  The risks and benefits of the procedure have been discussed at length with the patient. The patient understands the proposed procedure, potential alternative treatments, and the course of recovery to be expected. All of the patient's questions have been answered at this time. The patient wishes to proceed with surgery.  Earnstine Regal, MD, [facility] Office: [phone]

## 2016-03-07 ENCOUNTER — Encounter (HOSPITAL_COMMUNITY)
Admission: RE | Disposition: A | Discharge status: Home / Self Care | Payer: Self-pay | Source: Ambulatory Visit | Attending: Surgery

## 2016-03-07 ENCOUNTER — Ambulatory Visit (HOSPITAL_COMMUNITY)
Admission: RE | Admit: 2016-03-07 | Discharge: 2016-03-07 | Disposition: A | Discharge status: Home / Self Care | Payer: BC Managed Care – PPO | Source: Ambulatory Visit | Attending: Surgery | Admitting: Surgery

## 2016-03-07 ENCOUNTER — Encounter (HOSPITAL_COMMUNITY): Payer: Self-pay | Admitting: *Deleted

## 2016-03-07 ENCOUNTER — Ambulatory Visit (HOSPITAL_COMMUNITY): Payer: BC Managed Care – PPO | Admitting: Anesthesiology

## 2016-03-07 DIAGNOSIS — C73 Malignant neoplasm of thyroid gland: Secondary | ICD-10-CM | POA: Diagnosis not present

## 2016-03-07 DIAGNOSIS — E041 Nontoxic single thyroid nodule: Secondary | ICD-10-CM

## 2016-03-07 DIAGNOSIS — Z79899 Other long term (current) drug therapy: Secondary | ICD-10-CM | POA: Insufficient documentation

## 2016-03-07 HISTORY — PX: THYROIDECTOMY: SHX17

## 2016-03-07 SURGERY — THYROIDECTOMY
Anesthesia: General | Site: Neck

## 2016-03-07 MED ORDER — BUPIVACAINE HCL (PF) 0.5 % IJ SOLN
INTRAMUSCULAR | Status: AC
  Filled 2016-03-07: qty 30

## 2016-03-07 MED ORDER — MIDAZOLAM HCL 5 MG/5ML IJ SOLN
INTRAMUSCULAR | Status: DC | PRN
  Administered 2016-03-07: 2 mg via INTRAVENOUS

## 2016-03-07 MED ORDER — GLYCOPYRROLATE 0.2 MG/ML IJ SOLN
INTRAMUSCULAR | Status: DC | PRN
  Administered 2016-03-07: 0.4 mg via INTRAVENOUS

## 2016-03-07 MED ORDER — CEFAZOLIN SODIUM-DEXTROSE 2-4 GM/100ML-% IV SOLN
INTRAVENOUS | Status: AC
  Filled 2016-03-07: qty 100

## 2016-03-07 MED ORDER — CHLORHEXIDINE GLUCONATE CLOTH 2 % EX PADS: 6.0000 | MEDICATED_PAD | Freq: Once | CUTANEOUS | Status: DC

## 2016-03-07 MED ORDER — PROPOFOL 10 MG/ML IV BOLUS
INTRAVENOUS | Status: DC | PRN
  Administered 2016-03-07: 150 mg via INTRAVENOUS

## 2016-03-07 MED ORDER — DEXAMETHASONE SODIUM PHOSPHATE 10 MG/ML IJ SOLN
INTRAMUSCULAR | Status: DC | PRN
  Administered 2016-03-07: 10 mg via INTRAVENOUS

## 2016-03-07 MED ORDER — LIDOCAINE 2% (20 MG/ML) 5 ML SYRINGE
INTRAMUSCULAR | Status: DC | PRN
  Administered 2016-03-07: 100 mg via INTRAVENOUS

## 2016-03-07 MED ORDER — ONDANSETRON HCL 4 MG/2ML IJ SOLN
4.0000 mg | Freq: Once | INTRAMUSCULAR | Status: AC | PRN
  Administered 2016-03-07: 4 mg via INTRAVENOUS
  Filled 2016-03-07: qty 2

## 2016-03-07 MED ORDER — MEPERIDINE HCL 50 MG/ML IJ SOLN: 6.2500 mg | INTRAMUSCULAR | Status: DC | PRN

## 2016-03-07 MED ORDER — NEOSTIGMINE METHYLSULFATE 5 MG/5ML IV SOSY
PREFILLED_SYRINGE | INTRAVENOUS | Status: AC
  Filled 2016-03-07: qty 5

## 2016-03-07 MED ORDER — LACTATED RINGERS IV SOLN
INTRAVENOUS | Status: DC
  Administered 2016-03-07 (×2): via INTRAVENOUS

## 2016-03-07 MED ORDER — GLYCOPYRROLATE 0.2 MG/ML IV SOSY
PREFILLED_SYRINGE | INTRAVENOUS | Status: AC
  Filled 2016-03-07: qty 3

## 2016-03-07 MED ORDER — FENTANYL CITRATE (PF) 100 MCG/2ML IJ SOLN
INTRAMUSCULAR | Status: DC | PRN
  Administered 2016-03-07 (×2): 50 ug via INTRAVENOUS

## 2016-03-07 MED ORDER — ROCURONIUM BROMIDE 10 MG/ML (PF) SYRINGE
PREFILLED_SYRINGE | INTRAVENOUS | Status: DC | PRN
  Administered 2016-03-07: 40 mg via INTRAVENOUS

## 2016-03-07 MED ORDER — ONDANSETRON HCL 4 MG/2ML IJ SOLN
INTRAMUSCULAR | Status: DC | PRN
  Administered 2016-03-07: 4 mg via INTRAVENOUS

## 2016-03-07 MED ORDER — PROPOFOL 10 MG/ML IV BOLUS
INTRAVENOUS | Status: AC
  Filled 2016-03-07: qty 20

## 2016-03-07 MED ORDER — HYDROMORPHONE HCL 1 MG/ML IJ SOLN
INTRAMUSCULAR | Status: AC
  Administered 2016-03-07: 0.25 mg via INTRAVENOUS
  Filled 2016-03-07: qty 1

## 2016-03-07 MED ORDER — HYDROCODONE-ACETAMINOPHEN 5-325 MG PO TABS: 1.0000 | ORAL_TABLET | ORAL | 0 refills | Status: DC | PRN

## 2016-03-07 MED ORDER — HYDROMORPHONE HCL 1 MG/ML IJ SOLN
0.2500 mg | INTRAMUSCULAR | Status: DC | PRN
  Administered 2016-03-07 (×3): 0.25 mg via INTRAVENOUS

## 2016-03-07 MED ORDER — LIDOCAINE 2% (20 MG/ML) 5 ML SYRINGE
INTRAMUSCULAR | Status: AC
  Filled 2016-03-07: qty 5

## 2016-03-07 MED ORDER — ONDANSETRON HCL 4 MG/2ML IJ SOLN: 4.0000 mg | Freq: Once | INTRAMUSCULAR | Status: DC

## 2016-03-07 MED ORDER — MIDAZOLAM HCL 2 MG/2ML IJ SOLN
INTRAMUSCULAR | Status: AC
  Filled 2016-03-07: qty 2

## 2016-03-07 MED ORDER — SUGAMMADEX SODIUM 200 MG/2ML IV SOLN
INTRAVENOUS | Status: AC
  Filled 2016-03-07: qty 2

## 2016-03-07 MED ORDER — BUPIVACAINE HCL (PF) 0.5 % IJ SOLN
INTRAMUSCULAR | Status: DC | PRN
  Administered 2016-03-07: 5 mL

## 2016-03-07 MED ORDER — FENTANYL CITRATE (PF) 100 MCG/2ML IJ SOLN
INTRAMUSCULAR | Status: AC
  Filled 2016-03-07: qty 2

## 2016-03-07 MED ORDER — NEOSTIGMINE METHYLSULFATE 10 MG/10ML IV SOLN
INTRAVENOUS | Status: DC | PRN
Start: 2016-03-07 — End: 2016-03-07
  Administered 2016-03-07: 3 mg via INTRAVENOUS

## 2016-03-07 MED ORDER — CEFAZOLIN SODIUM-DEXTROSE 2-4 GM/100ML-% IV SOLN
2.0000 g | INTRAVENOUS | Status: AC
  Administered 2016-03-07: 2 g via INTRAVENOUS

## 2016-03-07 MED ORDER — SUCCINYLCHOLINE CHLORIDE 20 MG/ML IJ SOLN
INTRAMUSCULAR | Status: AC
  Filled 2016-03-07: qty 1

## 2016-03-07 MED ORDER — 0.9 % SODIUM CHLORIDE (POUR BTL) OPTIME
TOPICAL | Status: DC | PRN
  Administered 2016-03-07: 1000 mL

## 2016-03-07 MED ORDER — ROCURONIUM BROMIDE 50 MG/5ML IV SOSY
PREFILLED_SYRINGE | INTRAVENOUS | Status: AC
  Filled 2016-03-07: qty 5

## 2016-03-07 MED ORDER — DEXAMETHASONE SODIUM PHOSPHATE 10 MG/ML IJ SOLN
INTRAMUSCULAR | Status: AC
  Filled 2016-03-07: qty 1

## 2016-03-07 MED ORDER — ONDANSETRON HCL 4 MG/2ML IJ SOLN
INTRAMUSCULAR | Status: AC
  Filled 2016-03-07: qty 2

## 2016-03-07 SURGICAL SUPPLY — 44 items
ATTRACTOMAT 16X20 MAGNETIC DRP (DRAPES) ×3 IMPLANT
BENZOIN TINCTURE PRP APPL 2/3 (GAUZE/BANDAGES/DRESSINGS) IMPLANT
BLADE SURG 15 STRL LF DISP TIS (BLADE) ×1 IMPLANT
BLADE SURG 15 STRL SS (BLADE) ×2
CHLORAPREP W/TINT 26ML (MISCELLANEOUS) ×6 IMPLANT
CLIP TI MEDIUM 6 (CLIP) ×6 IMPLANT
CLIP TI WIDE RED SMALL 6 (CLIP) ×6 IMPLANT
CLOSURE WOUND 1/2 X4 (GAUZE/BANDAGES/DRESSINGS) ×1
DRAPE LAPAROTOMY T 98X78 PEDS (DRAPES) ×3 IMPLANT
DRESSING SURGICEL FIBRLLR 1X2 (HEMOSTASIS) ×1 IMPLANT
DRSG SURGICEL FIBRILLAR 1X2 (HEMOSTASIS) ×3
ELECT PENCIL ROCKER SW 15FT (MISCELLANEOUS) ×3 IMPLANT
ELECT REM PT RETURN 9FT ADLT (ELECTROSURGICAL) ×3
ELECTRODE REM PT RTRN 9FT ADLT (ELECTROSURGICAL) ×1 IMPLANT
GAUZE SPONGE 4X4 12PLY STRL (GAUZE/BANDAGES/DRESSINGS) ×3 IMPLANT
GAUZE SPONGE 4X4 16PLY XRAY LF (GAUZE/BANDAGES/DRESSINGS) ×3 IMPLANT
GLOVE BIO SURGEON STRL SZ7 (GLOVE) ×3 IMPLANT
GLOVE BIOGEL PI IND STRL 6.5 (GLOVE) ×1 IMPLANT
GLOVE BIOGEL PI IND STRL 7.0 (GLOVE) ×1 IMPLANT
GLOVE BIOGEL PI IND STRL 7.5 (GLOVE) ×1 IMPLANT
GLOVE BIOGEL PI INDICATOR 6.5 (GLOVE) ×2
GLOVE BIOGEL PI INDICATOR 7.0 (GLOVE) ×2
GLOVE BIOGEL PI INDICATOR 7.5 (GLOVE) ×2
GLOVE SURG ORTHO 8.0 STRL STRW (GLOVE) ×3 IMPLANT
GLOVE SURG SS PI 6.5 STRL IVOR (GLOVE) ×3 IMPLANT
GOWN STRL REUS W/TWL XL LVL3 (GOWN DISPOSABLE) ×6 IMPLANT
HEMOSTAT SURGICEL 2X4 FIBR (HEMOSTASIS) ×3 IMPLANT
ILLUMINATOR WAVEGUIDE N/F (MISCELLANEOUS) ×3 IMPLANT
KIT BASIN OR (CUSTOM PROCEDURE TRAY) ×3 IMPLANT
PACK BASIC VI WITH GOWN DISP (CUSTOM PROCEDURE TRAY) ×3 IMPLANT
SHEARS HARMONIC 9CM CVD (BLADE) ×3 IMPLANT
STAPLER VISISTAT 35W (STAPLE) IMPLANT
STRIP CLOSURE SKIN 1/2X4 (GAUZE/BANDAGES/DRESSINGS) ×2 IMPLANT
SUT MNCRL AB 4-0 PS2 18 (SUTURE) ×3 IMPLANT
SUT SILK 2 0 (SUTURE)
SUT SILK 2-0 18XBRD TIE 12 (SUTURE) IMPLANT
SUT SILK 3 0 (SUTURE)
SUT SILK 3-0 18XBRD TIE 12 (SUTURE) IMPLANT
SUT VIC AB 3-0 SH 18 (SUTURE) ×6 IMPLANT
SYR BULB IRRIGATION 50ML (SYRINGE) ×3 IMPLANT
TAPE CLOTH SURG 4X10 WHT LF (GAUZE/BANDAGES/DRESSINGS) ×3 IMPLANT
TOWEL OR 17X26 10 PK STRL BLUE (TOWEL DISPOSABLE) ×3 IMPLANT
TOWEL OR NON WOVEN STRL DISP B (DISPOSABLE) ×3 IMPLANT
YANKAUER SUCT BULB TIP 10FT TU (MISCELLANEOUS) ×3 IMPLANT

## 2016-03-07 NOTE — Interval H&P Note (Signed)
History and Physical Interval Note:  03/07/2016 9:17 AM  Crystal Humphrey  has presented today for surgery, with the diagnosis of THYROID NODULE.  The various methods of treatment have been discussed with the patient and family. After consideration of risks, benefits and other options for treatment, the patient has consented to    Procedure(s): THYROID ISTHMUSECTOMY (N/A) as a surgical intervention .    The patient's history has been reviewed, patient examined, no change in status, stable for surgery.  I have reviewed the patient's chart and labs.  Questions were answered to the patient's satisfaction.    Earnstine Regal, MD, [facility] Office: [place]

## 2016-03-07 NOTE — Transfer of Care (Signed)
Immediate Anesthesia Transfer of Care Note  Patient: Crystal Humphrey  Procedure(s) Performed: Procedure(s): THYROID ISTHMUSECTOMY (N/A)  Patient Location: PACU  Anesthesia Type:General  Level of Consciousness: sedated  Airway & Oxygen Therapy: Patient Spontanous Breathing and Patient connected to face mask oxygen  Post-op Assessment: Report given to RN and Post -op Vital signs reviewed and stable  Post vital signs: Reviewed and stable  Last Vitals:  Vitals:   03/07/16 0729  BP: 119/78  Pulse: 65  Resp: 16  Temp: 36.8 C    Last Pain:  Vitals:   03/07/16 0729  TempSrc: Oral         Complications: No apparent anesthesia complications

## 2016-03-07 NOTE — Anesthesia Procedure Notes (Signed)
Procedure Name: Intubation Date/Time: 03/07/2016 9:30 AM Performed by: Lind Covert Pre-anesthesia Checklist: Patient identified, Emergency Drugs available, Suction available, Patient being monitored and Timeout performed Patient Re-evaluated:Patient Re-evaluated prior to inductionOxygen Delivery Method: Circle system utilized Preoxygenation: Pre-oxygenation with 100% oxygen Intubation Type: IV induction Ventilation: Mask ventilation without difficulty Laryngoscope Size: Mac and 3 Grade View: Grade I Tube type: Oral Tube size: 7.0 mm Number of attempts: 1 Airway Equipment and Method: Stylet Placement Confirmation: ETT inserted through vocal cords under direct vision,  positive ETCO2 and breath sounds checked- equal and bilateral Secured at: 21 cm Tube secured with: Tape Dental Injury: Teeth and Oropharynx as per pre-operative assessment

## 2016-03-07 NOTE — Anesthesia Preprocedure Evaluation (Signed)
Anesthesia Evaluation  Patient identified by MRN, date of birth, ID band Patient awake    Reviewed: Allergy & Precautions, NPO status , Patient's Chart, lab work & pertinent test results  Airway Mallampati: I  TM Distance: >3 FB Neck ROM: Full    Dental   Pulmonary    Pulmonary exam normal        Cardiovascular Normal cardiovascular exam     Neuro/Psych    GI/Hepatic   Endo/Other    Renal/GU      Musculoskeletal   Abdominal   Peds  Hematology   Anesthesia Other Findings   Reproductive/Obstetrics                             Anesthesia Physical Anesthesia Plan  ASA: II  Anesthesia Plan: General   Post-op Pain Management:    Induction: Intravenous  Airway Management Planned: Oral ETT  Additional Equipment:   Intra-op Plan:   Post-operative Plan: Extubation in OR  Informed Consent: I have reviewed the patients History and Physical, chart, labs and discussed the procedure including the risks, benefits and alternatives for the proposed anesthesia with the patient or authorized representative who has indicated his/her understanding and acceptance.     Plan Discussed with: CRNA and Surgeon  Anesthesia Plan Comments:         Anesthesia Quick Evaluation  

## 2016-03-07 NOTE — Anesthesia Postprocedure Evaluation (Signed)
Anesthesia Post Note  Patient: Crystal Humphrey  Procedure(s) Performed: Procedure(s) (LRB): THYROID ISTHMUSECTOMY (N/A)  Patient location during evaluation: PACU Anesthesia Type: General Level of consciousness: awake and alert Pain management: pain level controlled Vital Signs Assessment: post-procedure vital signs reviewed and stable Respiratory status: spontaneous breathing, nonlabored ventilation, respiratory function stable and patient connected to nasal cannula oxygen Cardiovascular status: blood pressure returned to baseline and stable Postop Assessment: no signs of nausea or vomiting Anesthetic complications: no    Last Vitals:  Vitals:   03/07/16 1149 03/07/16 1250  BP: 130/80 134/81  Pulse: 64 71  Resp: 12 16  Temp: 36.4 C 36.4 C    Last Pain:  Vitals:   03/07/16 1250  TempSrc: Oral  PainSc: 3                  Kiran Carline DAVID

## 2016-03-07 NOTE — Brief Op Note (Signed)
03/07/2016  10:32 AM  PATIENT:  Crystal Humphrey  30 y.o. female  PRE-OPERATIVE DIAGNOSIS:  THYROID NODULE  POST-OPERATIVE DIAGNOSIS:  THYROID NODULE  PROCEDURE:  Procedure(s): THYROID ISTHMUSECTOMY (N/A)  SURGEON:  Surgeon(s) and Role:    * Armandina Gemma, MD - Primary  ANESTHESIA:   general  EBL:  Total I/O In: 1000 [I.V.:1000] Out: -   BLOOD ADMINISTERED:none  DRAINS: none   LOCAL MEDICATIONS USED:  MARCAINE     SPECIMEN:  Excision  DISPOSITION OF SPECIMEN:  PATHOLOGY  COUNTS:  YES  TOURNIQUET:  * No tourniquets in log *  DICTATION: .Other Dictation: Dictation Number [id number]  PLAN OF CARE: Discharge to home after PACU  PATIENT DISPOSITION:  PACU - hemodynamically stable.   Delay start of Pharmacological VTE agent (>24hrs) due to surgical blood loss or risk of bleeding: yes  Earnstine Regal, MD, [facility] Office: [phone]

## 2016-03-08 NOTE — Op Note (Signed)
NAMEROZENIA, SWANSEN NO.:  [id number]  MEDICAL RECORD NO.:  [id number]  LOCATION:  [facility]                         FACILITY:  [facility]  PHYSICIAN:  Earnstine Regal, MD      DATE OF BIRTH:  05-07-1985  DATE OF PROCEDURE:  03/07/2016                              OPERATIVE REPORT   PREOPERATIVE DIAGNOSIS:  Enlarging thyroid nodule.  POSTOPERATIVE DIAGNOSIS:  Enlarging thyroid nodule.  PROCEDURE:  Thyroid isthmusectomy.  SURGEON:  Earnstine Regal, MD  ANESTHESIA:  General.  ESTIMATED BLOOD LOSS:  Minimal.  PREPARATION:  ChloraPrep.  COMPLICATIONS:  None.  INDICATIONS:  The patient is a 30 year old female with a history of a solitary thyroid nodule involving the thyroid isthmus.  This has been followed by sequential ultrasound scanning and has shown enlargement. It is complex.  It now comes to Surgery for excision for definitive diagnosis.  BODY OF REPORT:  Procedure was done in OR #3 at the [facility].  The patient was brought to the operating room and placed in supine position on the operating room table.  Following administration of general anesthesia, the patient was positioned and then prepped and draped in the usual aseptic fashion.  After ascertaining that an adequate level of anesthesia had been achieved, an anterior cervical incision was made with a #15 blade.  Dissection was carried through the subcutaneous tissues and platysma.  Hemostasis was achieved with the electrocautery.  Subplatysmal flaps were developed cephalad and caudad.  Strap muscles were incised in the midline. Weitlaner retractor was placed for exposure.  Strap muscles were elevated off the thyroid isthmus and anterior portions of the thyroid lobes bilaterally.  Isthmus was mobilized superiorly and inferiorly with hemostasis achieved using the Harmonic scalpel and small Ligaclips.  The isthmus was mobilized off the trachea.  The thyroid parenchyma was transected  bilaterally with the Harmonic scalpel.  The isthmus was excised in its entirety with the nodule located centrally.  Suture was used to mark the left side of the isthmus.  Double suture was used to mark the right side of the isthmus.  The specimen was submitted to Pathology for review.  Two or three small lymph nodes were also removed from the area of the thyroid isthmus and submitted with the specimen.  The neck was irrigated with warm saline.  Good hemostasis was achieved throughout the operative field.  Fibrillar was placed throughout the operative field.  Strap muscles were reapproximated in the midline with interrupted 3-0 Vicryl sutures.  Platysma was closed with interrupted 3- 0 Vicryl sutures.  Skin was anesthetized with local anesthetic.  Skin edges were reapproximated with a running 4-0 Monocryl subcuticular suture.  Wound was washed and dried, and Steri-Strips were applied.  Sterile dressings were applied.  The patient was awakened from anesthesia and brought to the recovery room.  The patient tolerated the procedure well.   Earnstine Regal, MD, [facility] Office: [phone]   TMG/MEDQ  D:  03/07/2016  T:  03/08/2016  Job:  [id number]  cc:   Darlyn Chamber, M.D. Fax: [phone]

## 2016-04-02 ENCOUNTER — Ambulatory Visit: Payer: BC Managed Care – PPO | Admitting: Internal Medicine

## 2016-04-02 ENCOUNTER — Encounter: Payer: Self-pay | Admitting: Internal Medicine

## 2016-04-02 ENCOUNTER — Ambulatory Visit (INDEPENDENT_AMBULATORY_CARE_PROVIDER_SITE_OTHER): Payer: BC Managed Care – PPO | Admitting: Internal Medicine

## 2016-04-02 VITALS — BP 109/68 | HR 60 | Wt 134.0 lb

## 2016-04-02 DIAGNOSIS — Z9889 Other specified postprocedural states: Secondary | ICD-10-CM

## 2016-04-02 DIAGNOSIS — C73 Malignant neoplasm of thyroid gland: Secondary | ICD-10-CM

## 2016-04-02 LAB — TSH: TSH: 1.02 u[IU]/mL (ref 0.35–4.50)

## 2016-04-02 LAB — T3, FREE: T3 FREE: 4.4 pg/mL — AB (ref 2.3–4.2)

## 2016-04-02 LAB — T4, FREE: FREE T4: 0.87 ng/dL (ref 0.60–1.60)

## 2016-04-02 NOTE — Progress Notes (Addendum)
Patient ID: Crystal Humphrey, female   DOB: 1985-11-05, 30 y.o.   MRN: [id number]    HPI  Crystal Humphrey is a 30 y.o.-year-old female, referred by Dr. Harlow Asa, for management of papillary thyroid cancer and postsurgical hypothyroidism. She is here with her husband who offers part of the history.  Pt. has been dx with papillary thyroid cancer this month. She was initially found to have a complex thyroid nodule in the thyroid isthmus and this was followed with sequential ultrasounds with slight fluctuation in size.   The nodule was Bx'ed 06/07/2014: FLUS.  On the latest ultrasound from this year, the nodule has increased to 1.9 x 0.8 x 1.7 cm: 12/20/2015: Thyroid ultrasound:  Right thyroid lobe: 6.7 cm x 0.9 cm x 1.4 cm. Relatively homogeneous appearance of the right thyroid. Small colloid cyst at the inferior right thyroid.  Left thyroid lobe: 4.0 cm x 0.8 cm x 1.3 cm. Relatively homogeneous appearance of the left thyroid. Small colloid cyst at the inferior left thyroid.  Isthmus Thickness: 0.3 cm. Re- demonstration of isthmic nodule. Nodule measures 1.9 cm x 0.8 cm x 1.7 cm. This does measure slightly larger than the comparison of 1.4 cm x 0.9 cm x 1.4 cm. Mixed cystic and solid composition (1), with isoechoic echogenicity (1), extrathyroid extension (3). Total ACR points 5 for TI-RADS level of TR 4.  Lymphadenopathy: None visualized. IMPRESSION: Re- demonstration of dominant isthmic nodule, which appears to have cystic degeneration. This nodule does meet criteria for biopsy, which was reported to have previously occurred.  Patient had thyroid isthmusectomy on 03/07/2016 (Dr. Harlow Asa) and the pathology indicated carcinoma with both papillary and follicular patterns. The pathology was read by Dr. Casimer Lanius. I called and discussed with her about the pathology report and she recommended the cancer to be treated as papillary thyroid cancer, and not follicular variant of papillary thyroid cancer.  There was no extrathyroidal extension and 3 sampled lymph nodes were benign. The tumor had a capsule, however this was disrupted. It appears that there was focal capsular invasion. 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) ALL MARGINS OF RESECTION ARE NEGATIVE FOR CARCINOMA THREE BENIGN LYMPH NODES (0/3) UNREMARKABLE PARATHYROID GLAND (X1) Microscopic Comment THYROID Specimen: Thyroid isthmus Procedure (including lymph node sampling if applicable): Lobectomy Specimen Integrity (intact/fragmented): Intact Tumor focality: Focal Dominant tumor: Isthmus Maximum tumor size (cm): 2.1 cm Tumor laterality: Isthmus Histologic type (including subtype and/or unique features as applicable): Papillary thyroid carcinoma Tumor capsule: disrupted Extrathyroidal extension: Negative Capsular invasion with degree of invasion if present: Focal Second and additional tumors: NA Tumor size(s): NA Tumor laterality: NA Histologic type (including subtype and/or unique features as applicable) : NA Tumor capsule: NA Extrathyroidal extension: NA Capsular invasion with degree of invasion if present: NA Margins: Negative Lymphatic or vascular invasion: Negative Lymph nodes: # examined 3; # positive; 0 Extracapsular extension (if applicable): focal TNM code: pT2 , pN0 Non-neoplastic thyroid: Unremarkable Comments: The carcinoma has both papillary and follicular patterns. This case also reviewed by Dr. Gari Crown and agree. Casimer Lanius MD Pathologist, Electronic Signature (Case signed 03/10/2016  Patient will see Dr. Harlow Asa back on 04/04/2016 for a postop follow-up and to discuss the need for further intervention (total thyroidectomy).  Pt denies feeling nodules in neck, hoarseness, dysphagia/odynophagia.  No recent TFTs available for review.  Pt denies: - fatigue - weight gain - cold intolerance - constipation - dry skin - hair loss - depression  She has + FH of  thyroid disorders in:  Grandmothers. No FH of thyroid cancer.  No h/o radiation tx to head or neck.  ROS: Constitutional: no weight gain/loss, no fatigue, no subjective hyperthermia/hypothermia Eyes: no blurry vision, no xerophthalmia ENT: no sore throat, no nodules palpated in throat, no dysphagia/odynophagia, no hoarseness Cardiovascular: no CP/SOB/palpitations/leg swelling Respiratory: no cough/SOB Gastrointestinal: no N/V/D/C Musculoskeletal: no muscle/joint aches Skin: no rashes Neurological: no tremors/numbness/tingling/dizziness Psychiatric: no depression/anxiety  Past Medical History:  Diagnosis Date  . Medical history non-contributory   . Thyroid nodule    Past Surgical History:  Procedure Laterality Date  . biopsy on thyroid    . SKIN CYST REMOVED  AGE 53  . THYROIDECTOMY N/A 03/07/2016   Procedure: THYROID ISTHMUSECTOMY;  Surgeon: Armandina Gemma, MD;  Location: [facility];  Service: General;  Laterality: N/A;  . WISDOM TOOTH EXTRACTION  AGE 53   Social History   Social History  . Marital status: Married    Spouse name: N/A  . Number of children: 1   Occupational History  . Music teacher    Social History Main Topics  . Smoking status: Never Smoker  . Smokeless tobacco: Never Used  . Alcohol use No  . Drug use: No   Current Outpatient Prescriptions on File Prior to Visit  Medication Sig Dispense Refill  . benzoyl peroxide 10 % gel Apply 1 application topically 2 (two) times daily. Acne    . Multiple Vitamin (MULTI-VITAMINS) TABS Take 1 tablet by mouth daily.    . naproxen sodium (ANAPROX) 220 MG tablet Take 440 mg by mouth daily as needed (headache).    . norethindrone (SHAROBEL) 0.35 MG tablet Take 1 tablet by mouth at bedtime.      No current facility-administered medications on file prior to visit.    No Known Allergies   Family history: -Please see history of present illness  PE: BP 109/68   Pulse 60   Wt 134 lb (60.8 kg)   SpO2 96%   BMI 21.96  kg/m  Wt Readings from Last 3 Encounters:  04/02/16 134 lb (60.8 kg)  03/07/16 133 lb (60.3 kg)  02/29/16 133 lb 3.2 oz (60.4 kg)   Constitutional: overweight, in NAD Eyes: PERRLA, EOMI, no exophthalmos ENT: moist mucous membranes, Cervical scar healing, no cervical lymphadenopathy Cardiovascular: RRR, No MRG Respiratory: CTA B Gastrointestinal: abdomen soft, NT, ND, BS+ Musculoskeletal: no deformities, strength intact in all 4 Skin: moist, warm, no rashes Neurological: no tremor with outstretched hands, DTR normal in all 4  ASSESSMENT: 1. Thyroid cancer - see HPI  2. History of isthmusectomy  PLAN:  1. Thyroid cancer - papillary - I had a long discussion with the patient and her husband about her recent diagnosis of thyroid cancer.  I reassured her that papillary thyroid cancer is a slow growing cancer with good prognosis; her life expectancy is unlikely to be reduced due to the cancer.  - We reviewed together the pathology >> she is stage 1 TNM due to age. I d/w Dr. Orene Desanctis, who read the sample and she advised that this is a classic-variant of PTC, despite presence of follicular patterns. - we discussed about the newer ThyCA guidelines (2015) that conclude that for selected low-interm. risk pts (unifocal tumors, <4 cm, and no evidence of extrathyroidal invasion or LN mets), the extent of initial thyroid surgery probably has little impact on disease specific survival. Even if some patients will develop recurrence or metastasis, salvage therapy is very effective. However, patient is very young and the studies that evaluated  a more invasive versus a less invasive therapy were not long enough. In her case, I explained that we have 3 options:  1. Total thyroidectomy and Thyrogen- stimulated RAI treatment for post-op thyroid remnant ablation. I explained that the main role of this is to facilitate monitoring in the long run (by checking thyroglobulin). We discussed about radiation  precautions. 2. No further intervention and monitoring yearly by ultrasound and thyroglobulin 3. No further intervention for now, but proceeding with total thyroidectomy and RAI treatment in few years, after having children (patient and husband reveal that they are thinking about getting pregnant again in the next 1-2 months).  - Patient will think about the above options and let me and Dr. Harlow Asa know about her decision - 4 today, will check a TSH, free T3 and a free T4. If these are normal, and if she decides against further surgery, I will repeat her TFTs in 2-3 months and will add a thyroglobulin level at that time - we will also check a thyroglobulin and Tg antibodies, along with a neck ultrasound at next visit - I will see the patient back in 1 year  2. History of isthmusectomy - Will check TFTs today - Cervical scar appears healing and patient does not have erythema, swelling, dysesthesia - Advised to apply Mederma or cocoa butter on the scar  Component     Latest Ref Rng & Units 04/02/2016  TSH     0.35 - 4.50 uIU/mL 1.02  T4,Free(Direct)     0.60 - 1.60 ng/dL 0.87  Triiodothyronine,Free,Serum     2.3 - 4.2 pg/mL 4.4 (H)   TSH and free T4 normal, with free T3 slightly high. This is of no consequence, but I would like to repeat her labs in 2 months to make sure they remain stable. Will also add a thyroglobulin and ATA antibodies then for baseline.  Philemon Kingdom, MD PhD [facility]

## 2016-04-02 NOTE — Patient Instructions (Signed)
Please stop at the lab.  Please come back in 1 year. 

## 2016-04-03 NOTE — Progress Notes (Signed)
Lab appointment  06/12/16 @ 3:45pm

## 2016-04-24 ENCOUNTER — Ambulatory Visit: Payer: BC Managed Care – PPO | Admitting: Internal Medicine

## 2016-06-12 ENCOUNTER — Other Ambulatory Visit: Payer: BC Managed Care – PPO

## 2016-06-17 ENCOUNTER — Other Ambulatory Visit: Payer: BC Managed Care – PPO

## 2016-06-26 ENCOUNTER — Other Ambulatory Visit (INDEPENDENT_AMBULATORY_CARE_PROVIDER_SITE_OTHER): Payer: BC Managed Care – PPO

## 2016-06-26 DIAGNOSIS — Z9889 Other specified postprocedural states: Secondary | ICD-10-CM

## 2016-06-26 DIAGNOSIS — C73 Malignant neoplasm of thyroid gland: Secondary | ICD-10-CM

## 2016-06-26 LAB — TSH: TSH: 2.31 u[IU]/mL (ref 0.35–4.50)

## 2016-06-26 LAB — T4, FREE: Free T4: 0.75 ng/dL (ref 0.60–1.60)

## 2016-06-26 LAB — T3, FREE: T3, Free: 4.7 pg/mL — ABNORMAL HIGH (ref 2.3–4.2)

## 2016-06-27 ENCOUNTER — Telehealth: Payer: Self-pay

## 2016-06-27 LAB — THYROGLOBULIN ANTIBODY

## 2016-06-27 LAB — THYROGLOBULIN LEVEL: THYROGLOBULIN: 12.1 ng/mL

## 2016-06-27 NOTE — Telephone Encounter (Signed)
-----   Message from Philemon Kingdom, MD sent at 06/27/2016  1:54 PM EST ----- Almyra Free, can you please call pt: Thyroid tests are normal, except one of them which is slightly higher, however, no intervention needed for now - I will keep an eye on the tests.

## 2016-06-27 NOTE — Telephone Encounter (Signed)
LVM, gave lab results. Gave call back number if any questions or concerns.  

## 2016-08-08 LAB — OB RESULTS CONSOLE GC/CHLAMYDIA
Chlamydia: NEGATIVE
GC PROBE AMP, GENITAL: NEGATIVE

## 2016-08-08 LAB — OB RESULTS CONSOLE HEPATITIS B SURFACE ANTIGEN: Hepatitis B Surface Ag: NEGATIVE

## 2016-08-08 LAB — OB RESULTS CONSOLE RPR: RPR: NONREACTIVE

## 2016-08-08 LAB — OB RESULTS CONSOLE RUBELLA ANTIBODY, IGM: RUBELLA: IMMUNE

## 2016-08-08 LAB — OB RESULTS CONSOLE ANTIBODY SCREEN: ANTIBODY SCREEN: NEGATIVE

## 2016-08-08 LAB — OB RESULTS CONSOLE HIV ANTIBODY (ROUTINE TESTING): HIV: NONREACTIVE

## 2016-08-08 LAB — OB RESULTS CONSOLE ABO/RH: RH TYPE: POSITIVE

## 2016-12-11 ENCOUNTER — Ambulatory Visit: Payer: BC Managed Care – PPO | Admitting: Family Medicine

## 2017-02-16 NOTE — Progress Notes (Signed)
Cardiology Office Note  Date:  02/17/2017   ID:  Crystal Humphrey, DOB 1985/07/14, MRN [id number]  PCP:  Maryruth Eve, MD   Chief Complaint  Patient presents with  . other    New patient. Referred for Palpitation. Meds reviewed verbally with patient.     HPI:  Crystal Humphrey is a 31 year old woman Who presents by referral from Dr. Gaetano Net (OB/GYN) for consultation of her palpitations , Chest pressure, near syncope Currently pregnant, 4 weeks to delivery  She reports a long history of palpitations dating back to when she was younger Previously described as a strong beat followed by a short pause then with resumption of her regular rhythm  She has had these in her third trimester Did not have particular symptoms and first and second trimester We'll recently has had rare symptoms of Squeezing in left chest , 10 second induration Sometimes makes her feel lightheaded Uncertain if this is a palpitation or arrhythmia seems to happen more at nighttime Sometimes 2 to 3 x at night More episodes in the past month or two She wants to make sure that everything is okay before delivery  She does report episodes of  "passouts" Lightheaded spells seem to progress to "passout spells" Usually triggered by a pain always had these episodes, no recent acceleration  EKG personally reviewed by myself on todays visit Shows normal sinus rhythm rate 65 bpm no significant ST or T-wave changes   PMH:   has a past medical history of Medical history non-contributory and Thyroid nodule.  PSH:    Past Surgical History:  Procedure Laterality Date  . biopsy on thyroid    . SKIN CYST REMOVED  AGE 60  . THYROIDECTOMY N/A 03/07/2016   Procedure: THYROID ISTHMUSECTOMY;  Surgeon: Armandina Gemma, MD;  Location: [facility];  Service: General;  Laterality: N/A;  . WISDOM TOOTH EXTRACTION  AGE 60    Current Outpatient Prescriptions  Medication Sig Dispense Refill  . benzoyl peroxide 10 % gel Apply 1 application  topically 2 (two) times daily. Acne    . Multiple Vitamin (MULTI-VITAMINS) TABS Take 1 tablet by mouth daily.     No current facility-administered medications for this visit.      Allergies:   Patient has no known allergies.   Social History:  The patient  reports that she has never smoked. She has never used smokeless tobacco. She reports that she does not drink alcohol or use drugs.   Family History:   family history is not on file.    Review of Systems: Review of Systems  Constitutional: Negative.   Respiratory: Negative.   Cardiovascular: Positive for palpitations.       Chest pressure  Gastrointestinal: Negative.   Musculoskeletal: Negative.   Neurological: Negative.   Psychiatric/Behavioral: Negative.   All other systems reviewed and are negative.    PHYSICAL EXAM: VS:  BP 110/60 (BP Location: Left Arm, Patient Position: Sitting, Cuff Size: Normal)   Pulse 65   Ht 5' 5.5" (1.664 m)   Wt 177 lb 4 oz (80.4 kg)   BMI 29.05 kg/m  , BMI Body mass index is 29.05 kg/m. GEN: Well nourished, well developed, in no acute distress  HEENT: normal  Neck: no JVD, carotid bruits, or masses Cardiac: RRR; no murmurs, rubs, or gallops,no edema  Respiratory:  clear to auscultation bilaterally, normal work of breathing GI: soft, nontender, nondistended, + BS MS: no deformity or atrophy  Skin: warm and dry, no rash Neuro:  Strength and sensation  are intact Psych: euthymic mood, full affect    Recent Labs: 02/29/2016: Hemoglobin 13.1; Platelets 258 06/26/2016: TSH 2.31    Lipid Panel No results found for: CHOL, HDL, LDLCALC, TRIG    Wt Readings from Last 3 Encounters:  02/17/17 177 lb 4 oz (80.4 kg)  04/02/16 134 lb (60.8 kg)  03/07/16 133 lb (60.3 kg)       ASSESSMENT AND PLAN:  Palpitations - Plan: EKG 12-Lead Ectopy appreciated on exam today,unable to exclude APCs or PVCs We offered to run EKG for longer./Rhythm strip She could not stay she had to pick up her  son We offered Holter monitor, event monitor, she has declined at this time Likely having benign ectopy. Given minimal symptoms or tolerable symptoms, would not recommend beta blockers at this time If symptoms get worse recommended event monitor  Chest pressure Reports having periodic chest pressure left of her upper sternum Nonexertional Clinical exam fine, normal EKG, resenting at rest Unable to exclude stress, musculoskeletal Again we offered event monitor if symptoms get worse No symptoms at rest and with exertion Suggested she call us if she would like additional workup otherwise based on the above, no mandatory testing needed prior to delivery  Near syncope/syncope Symptoms suggested of vasovagal syncope Recommended she stay hydrated Also suggested she lay down for any dizzy spells  Disposition:   F/U  As needed   Total encounter time more than 60 minutes  Greater than 50% was spent in counseling and coordination of care with the patient   patient seen in consultation for Dr. Gaetano Net and will be referred back to his office for ongoing care of the issues detailed above   Orders Placed This Encounter  Procedures  . EKG 12-Lead     Signed, Esmond Plants, M.D., Ph.D. 02/17/2017  [place], [place]

## 2017-02-17 ENCOUNTER — Encounter: Payer: Self-pay | Admitting: Cardiovascular Disease

## 2017-02-17 ENCOUNTER — Ambulatory Visit (INDEPENDENT_AMBULATORY_CARE_PROVIDER_SITE_OTHER): Payer: BC Managed Care – PPO | Admitting: Cardiovascular Disease

## 2017-02-17 VITALS — BP 110/60 | HR 65 | Ht 65.5 in | Wt 177.2 lb

## 2017-02-17 DIAGNOSIS — R55 Syncope and collapse: Secondary | ICD-10-CM

## 2017-02-17 DIAGNOSIS — R0789 Other chest pain: Secondary | ICD-10-CM

## 2017-02-17 DIAGNOSIS — R002 Palpitations: Secondary | ICD-10-CM

## 2017-02-17 NOTE — Patient Instructions (Signed)
Medication Instructions:   No medication changes made  Labwork:  No new labs needed  Testing/Procedures:  No further testing at this time   Follow-Up: It was a pleasure seeing you in the office today. Please call us if you have new issues that need to be addressed before your next appt.  [phone]  Your physician wants you to follow-up in:  As needed  If you need a refill on your cardiac medications before your next appointment, please call your pharmacy.     

## 2017-02-21 LAB — OB RESULTS CONSOLE GBS: STREP GROUP B AG: NEGATIVE

## 2017-03-09 ENCOUNTER — Encounter: Payer: Self-pay | Admitting: Family

## 2017-03-09 ENCOUNTER — Ambulatory Visit (INDEPENDENT_AMBULATORY_CARE_PROVIDER_SITE_OTHER): Payer: BC Managed Care – PPO | Admitting: Family

## 2017-03-09 VITALS — BP 112/68 | HR 65 | Temp 97.6°F | Ht 65.5 in | Wt 180.8 lb

## 2017-03-09 DIAGNOSIS — R002 Palpitations: Secondary | ICD-10-CM | POA: Diagnosis not present

## 2017-03-09 DIAGNOSIS — C73 Malignant neoplasm of thyroid gland: Secondary | ICD-10-CM | POA: Diagnosis not present

## 2017-03-09 DIAGNOSIS — L989 Disorder of the skin and subcutaneous tissue, unspecified: Secondary | ICD-10-CM | POA: Diagnosis not present

## 2017-03-09 NOTE — Patient Instructions (Signed)
Referral to dermatology  Best of luck with delivery!

## 2017-03-09 NOTE — Progress Notes (Signed)
Pre visit review using our clinic review tool, if applicable. No additional management support is needed unless otherwise documented below in the visit note. 

## 2017-03-09 NOTE — Progress Notes (Signed)
Subjective:    Patient ID: Crystal Humphrey, female    DOB: [birth year], 31 y.o.   MRN: [id number]  CC: [name] is a 31 y.o. female who presents today to establish care.    HPI: Overall feeling well. [redacted] weeks pregnant  Concerned for changing mole left chest wall. Noticed new 'black specks'. Non bleeding.   Skin tag on left inner arm- noticed 2 years ago, slightly bigger. Not bleeding  NO h/o skin cancer.    Dr Gaetano Net- OB GYN- currently [redacted] weeks pregnant [place]- for palpiations, near syncope in first trimester; resolved at this time.  Dr Mayra Neer- s/p partial thyroidectomy; thyroid cancer- dx 2016; 'up in the air' if rest of gland would be removed.  HISTORY:  Past Medical History:  Diagnosis Date  . Allergy   . Cancer ([place])   . Medical history non-contributory   . Thyroid nodule    Past Surgical History:  Procedure Laterality Date  . biopsy on thyroid    . SKIN CYST REMOVED  AGE 20  . WISDOM TOOTH EXTRACTION  AGE 20   Family History  Problem Relation Age of Onset  . Hyperlipidemia Father   . Cancer Maternal Grandmother        Breast  . Arthritis Maternal Grandfather   . Diabetes Paternal Grandfather   . Liver disease Paternal Grandfather     Allergies: Patient has no known allergies. Current Outpatient Medications on File Prior to Visit  Medication Sig Dispense Refill  . benzoyl peroxide 10 % gel Apply 1 application topically 2 (two) times daily. Acne    . cetirizine (ZYRTEC) 10 MG tablet Take 10 mg daily by mouth.    . Multiple Vitamin (MULTI-VITAMINS) TABS Take 1 tablet by mouth daily.     No current facility-administered medications on file prior to visit.     Social History   Tobacco Use  . Smoking status: Never Smoker  . Smokeless tobacco: Never Used  Substance Use Topics  . Alcohol use: No  . Drug use: No    Review of Systems  Constitutional: Negative for chills and fever.  Respiratory: Negative for cough.   Cardiovascular: Negative for  chest pain and palpitations.  Gastrointestinal: Negative for nausea and vomiting.  Skin: Positive for color change. Negative for rash and wound.      Objective:    BP 112/68   Pulse 65   Temp 97.6 F (36.4 C) (Oral)   Ht 5' 5.5" (1.664 m)   Wt 180 lb 12.8 oz (82 kg)   SpO2 97%   BMI 29.63 kg/m  BP Readings from Last 3 Encounters:  03/09/17 112/68  02/17/17 110/60  04/02/16 109/68   Wt Readings from Last 3 Encounters:  03/09/17 180 lb 12.8 oz (82 kg)  02/17/17 177 lb 4 oz (80.4 kg)  04/02/16 134 lb (60.8 kg)    Physical Exam  Constitutional: She appears well-developed and well-nourished.  Eyes: Conjunctivae are normal.  Cardiovascular: Normal rate, regular rhythm, normal heart sounds and normal pulses.  Pulmonary/Chest: Effort normal and breath sounds normal. She has no wheezes. She has no rhonchi. She has no rales.  Neurological: She is alert.  Skin: Skin is warm and dry.     Skin colored, skin tag noted left medial side of forearm as noted on diagram. Pedunculated brown colored mole noted left-sided chest. Black specks noted  Psychiatric: She has a normal mood and affect. Her speech is normal and behavior is normal. Thought content normal.  Vitals  reviewed.      Assessment & Plan:   Problem List Items Addressed This Visit      Endocrine   Papillary carcinoma of thyroid ([place])    Follows with Dr Mayra Neer; will follow.         Other   Palpitations    Resolved at this time.       Changing skin lesion - Primary    Based on changing  Presentation of lesions and desire to have skin tag removed, referral to dermatology placed.       Relevant Orders   Ambulatory referral to Dermatology       I am having Kailei Aikey maintain her MULTI-VITAMINS, benzoyl peroxide, and cetirizine.   Meds ordered this encounter  Medications  . cetirizine (ZYRTEC) 10 MG tablet    Sig: Take 10 mg daily by mouth.    Return precautions given.   Risks, benefits, and  alternatives of the medications and treatment plan prescribed today were discussed, and patient expressed understanding.   Education regarding symptom management and diagnosis given to patient on AVS.  Continue to follow with Maryruth Eve, MD for routine health maintenance.   Allexa Housey and I agreed with plan.   Mable Paris, FNP

## 2017-03-10 DIAGNOSIS — L989 Disorder of the skin and subcutaneous tissue, unspecified: Secondary | ICD-10-CM | POA: Insufficient documentation

## 2017-03-10 NOTE — Assessment & Plan Note (Signed)
Resolved at this time.  °

## 2017-03-10 NOTE — Assessment & Plan Note (Addendum)
Follows with Dr Mayra Neer; will follow.

## 2017-03-10 NOTE — Assessment & Plan Note (Signed)
Based on changing  Presentation of lesions and desire to have skin tag removed, referral to dermatology placed.

## 2017-03-11 ENCOUNTER — Encounter (HOSPITAL_COMMUNITY): Payer: Self-pay | Admitting: *Deleted

## 2017-03-11 ENCOUNTER — Telehealth (HOSPITAL_COMMUNITY): Payer: Self-pay | Admitting: *Deleted

## 2017-03-11 NOTE — Telephone Encounter (Signed)
Preadmission screen  

## 2017-03-12 ENCOUNTER — Other Ambulatory Visit: Payer: Self-pay | Admitting: Surgery

## 2017-03-12 DIAGNOSIS — Z85850 Personal history of malignant neoplasm of thyroid: Secondary | ICD-10-CM

## 2017-03-13 ENCOUNTER — Encounter: Payer: Self-pay | Admitting: Family Medicine

## 2017-03-16 ENCOUNTER — Other Ambulatory Visit: Payer: Self-pay

## 2017-03-16 ENCOUNTER — Encounter (HOSPITAL_COMMUNITY): Payer: Self-pay | Admitting: Anesthesiology

## 2017-03-16 ENCOUNTER — Encounter (HOSPITAL_COMMUNITY): Payer: Self-pay | Admitting: *Deleted

## 2017-03-16 ENCOUNTER — Inpatient Hospital Stay (HOSPITAL_COMMUNITY)
Admission: AD | Admit: 2017-03-16 | Discharge: 2017-03-17 | DRG: 807 | Disposition: A | Discharge status: Home / Self Care | Payer: BC Managed Care – PPO | Source: Ambulatory Visit | Attending: Obstetrics and Gynecology | Admitting: Obstetrics and Gynecology

## 2017-03-16 ENCOUNTER — Encounter (HOSPITAL_COMMUNITY): Payer: Self-pay | Admitting: Certified Registered Nurse Anesthetist

## 2017-03-16 DIAGNOSIS — Z3A40 40 weeks gestation of pregnancy: Secondary | ICD-10-CM | POA: Diagnosis not present

## 2017-03-16 DIAGNOSIS — Z3483 Encounter for supervision of other normal pregnancy, third trimester: Secondary | ICD-10-CM | POA: Diagnosis present

## 2017-03-16 HISTORY — DX: Headache, unspecified: R51.9

## 2017-03-16 HISTORY — DX: Headache: R51

## 2017-03-16 HISTORY — DX: Palpitations: R00.2

## 2017-03-16 LAB — CBC
HEMATOCRIT: 33.8 % — AB (ref 36.0–46.0)
HEMOGLOBIN: 11.3 g/dL — AB (ref 12.0–15.0)
MCH: 30.5 pg (ref 26.0–34.0)
MCHC: 33.4 g/dL (ref 30.0–36.0)
MCV: 91.4 fL (ref 78.0–100.0)
Platelets: 236 10*3/uL (ref 150–400)
RBC: 3.7 MIL/uL — AB (ref 3.87–5.11)
RDW: 14.5 % (ref 11.5–15.5)
WBC: 10.9 10*3/uL — AB (ref 4.0–10.5)

## 2017-03-16 LAB — TYPE AND SCREEN
ABO/RH(D): O POS
Antibody Screen: NEGATIVE

## 2017-03-16 LAB — RPR: RPR: NONREACTIVE

## 2017-03-16 MED ORDER — LACTATED RINGERS IV SOLN
500.0000 mL | Freq: Once | INTRAVENOUS | Status: AC
  Administered 2017-03-16: 500 mL via INTRAVENOUS

## 2017-03-16 MED ORDER — SOD CITRATE-CITRIC ACID 500-334 MG/5ML PO SOLN: 30.0000 mL | ORAL | Status: DC | PRN

## 2017-03-16 MED ORDER — TETANUS-DIPHTH-ACELL PERTUSSIS 5-2.5-18.5 LF-MCG/0.5 IM SUSP: 0.5000 mL | Freq: Once | INTRAMUSCULAR | Status: DC

## 2017-03-16 MED ORDER — DIBUCAINE 1 % RE OINT: 1.0000 "application " | TOPICAL_OINTMENT | RECTAL | Status: DC | PRN

## 2017-03-16 MED ORDER — DIPHENHYDRAMINE HCL 25 MG PO CAPS: 25.0000 mg | ORAL_CAPSULE | Freq: Four times a day (QID) | ORAL | Status: DC | PRN

## 2017-03-16 MED ORDER — ONDANSETRON HCL 4 MG/2ML IJ SOLN: 4.0000 mg | INTRAMUSCULAR | Status: DC | PRN

## 2017-03-16 MED ORDER — WITCH HAZEL-GLYCERIN EX PADS: 1.0000 "application " | MEDICATED_PAD | CUTANEOUS | Status: DC | PRN

## 2017-03-16 MED ORDER — EPHEDRINE 5 MG/ML INJ
10.0000 mg | INTRAVENOUS | Status: DC | PRN
  Filled 2017-03-16: qty 2

## 2017-03-16 MED ORDER — LABETALOL HCL 5 MG/ML IV SOLN
INTRAVENOUS | Status: AC
  Filled 2017-03-16: qty 4

## 2017-03-16 MED ORDER — OXYTOCIN BOLUS FROM INFUSION
500.0000 mL | Freq: Once | INTRAVENOUS | Status: AC
  Administered 2017-03-16: 500 mL via INTRAVENOUS

## 2017-03-16 MED ORDER — PHENYLEPHRINE 40 MCG/ML (10ML) SYRINGE FOR IV PUSH (FOR BLOOD PRESSURE SUPPORT)
80.0000 ug | PREFILLED_SYRINGE | INTRAVENOUS | Status: DC | PRN
  Filled 2017-03-16: qty 5
  Filled 2017-03-16: qty 10

## 2017-03-16 MED ORDER — BUTORPHANOL TARTRATE 1 MG/ML IJ SOLN
1.0000 mg | INTRAMUSCULAR | Status: DC | PRN
  Administered 2017-03-16: 1 mg via INTRAVENOUS
  Filled 2017-03-16: qty 1

## 2017-03-16 MED ORDER — LACTATED RINGERS IV SOLN: 500.0000 mL | Freq: Once | INTRAVENOUS | Status: DC

## 2017-03-16 MED ORDER — ONDANSETRON HCL 4 MG/2ML IJ SOLN: 4.0000 mg | Freq: Four times a day (QID) | INTRAMUSCULAR | Status: DC | PRN

## 2017-03-16 MED ORDER — SENNOSIDES-DOCUSATE SODIUM 8.6-50 MG PO TABS
2.0000 | ORAL_TABLET | ORAL | Status: DC
  Administered 2017-03-16: 2 via ORAL
  Filled 2017-03-16: qty 2

## 2017-03-16 MED ORDER — MEDROXYPROGESTERONE ACETATE 150 MG/ML IM SUSP: 150.0000 mg | INTRAMUSCULAR | Status: DC | PRN

## 2017-03-16 MED ORDER — IBUPROFEN 600 MG PO TABS
600.0000 mg | ORAL_TABLET | Freq: Four times a day (QID) | ORAL | Status: DC
  Administered 2017-03-16 – 2017-03-17 (×4): 600 mg via ORAL
  Filled 2017-03-16 (×4): qty 1

## 2017-03-16 MED ORDER — PHENYLEPHRINE 40 MCG/ML (10ML) SYRINGE FOR IV PUSH (FOR BLOOD PRESSURE SUPPORT): 80.0000 ug | PREFILLED_SYRINGE | INTRAVENOUS | Status: DC | PRN

## 2017-03-16 MED ORDER — PRENATAL MULTIVITAMIN CH
1.0000 | ORAL_TABLET | Freq: Every day | ORAL | Status: DC
  Administered 2017-03-17: 1 via ORAL
  Filled 2017-03-16: qty 1

## 2017-03-16 MED ORDER — OXYCODONE-ACETAMINOPHEN 5-325 MG PO TABS: 1.0000 | ORAL_TABLET | ORAL | Status: DC | PRN

## 2017-03-16 MED ORDER — ACETAMINOPHEN 325 MG PO TABS
650.0000 mg | ORAL_TABLET | ORAL | Status: DC | PRN
  Administered 2017-03-17: 650 mg via ORAL
  Filled 2017-03-16: qty 2

## 2017-03-16 MED ORDER — OXYCODONE-ACETAMINOPHEN 5-325 MG PO TABS: 2.0000 | ORAL_TABLET | ORAL | Status: DC | PRN

## 2017-03-16 MED ORDER — TERBUTALINE SULFATE 1 MG/ML IJ SOLN
0.2500 mg | Freq: Once | INTRAMUSCULAR | Status: DC | PRN
Start: 2017-03-16 — End: 2017-03-16
  Filled 2017-03-16: qty 1

## 2017-03-16 MED ORDER — SIMETHICONE 80 MG PO CHEW: 80.0000 mg | CHEWABLE_TABLET | ORAL | Status: DC | PRN

## 2017-03-16 MED ORDER — FENTANYL 2.5 MCG/ML BUPIVACAINE 1/10 % EPIDURAL INFUSION (WH - ANES)
14.0000 mL/h | INTRAMUSCULAR | Status: DC | PRN
  Filled 2017-03-16: qty 100

## 2017-03-16 MED ORDER — EPHEDRINE 5 MG/ML INJ: 10.0000 mg | INTRAVENOUS | Status: DC | PRN

## 2017-03-16 MED ORDER — LIDOCAINE HCL (PF) 1 % IJ SOLN
30.0000 mL | INTRAMUSCULAR | Status: DC | PRN
  Administered 2017-03-16: 30 mL via SUBCUTANEOUS
  Filled 2017-03-16: qty 30

## 2017-03-16 MED ORDER — MEASLES, MUMPS & RUBELLA VAC ~~LOC~~ INJ: 0.5000 mL | INJECTION | Freq: Once | SUBCUTANEOUS | Status: DC

## 2017-03-16 MED ORDER — LACTATED RINGERS IV SOLN: 500.0000 mL | INTRAVENOUS | Status: DC | PRN

## 2017-03-16 MED ORDER — OXYTOCIN 40 UNITS IN LACTATED RINGERS INFUSION - SIMPLE MED
2.5000 [IU]/h | INTRAVENOUS | Status: DC
  Administered 2017-03-16: 2.5 [IU]/h via INTRAVENOUS

## 2017-03-16 MED ORDER — LACTATED RINGERS IV SOLN
INTRAVENOUS | Status: DC
  Administered 2017-03-16 (×2): via INTRAVENOUS

## 2017-03-16 MED ORDER — OXYTOCIN 40 UNITS IN LACTATED RINGERS INFUSION - SIMPLE MED
1.0000 m[IU]/min | INTRAVENOUS | Status: DC
  Administered 2017-03-16: 2 m[IU]/min via INTRAVENOUS
  Filled 2017-03-16: qty 1000

## 2017-03-16 MED ORDER — PHENYLEPHRINE 40 MCG/ML (10ML) SYRINGE FOR IV PUSH (FOR BLOOD PRESSURE SUPPORT)
80.0000 ug | PREFILLED_SYRINGE | INTRAVENOUS | Status: DC | PRN
  Filled 2017-03-16: qty 5

## 2017-03-16 MED ORDER — ACETAMINOPHEN 325 MG PO TABS: 650.0000 mg | ORAL_TABLET | ORAL | Status: DC | PRN

## 2017-03-16 MED ORDER — COCONUT OIL OIL: 1.0000 "application " | TOPICAL_OIL | Status: DC | PRN

## 2017-03-16 MED ORDER — ONDANSETRON HCL 4 MG PO TABS
4.0000 mg | ORAL_TABLET | ORAL | Status: DC | PRN
Start: 2017-03-16 — End: 2017-03-17

## 2017-03-16 MED ORDER — DIPHENHYDRAMINE HCL 50 MG/ML IJ SOLN: 12.5000 mg | INTRAMUSCULAR | Status: DC | PRN

## 2017-03-16 MED ORDER — BENZOCAINE-MENTHOL 20-0.5 % EX AERO
1.0000 "application " | INHALATION_SPRAY | CUTANEOUS | Status: DC | PRN
  Administered 2017-03-16: 1 via TOPICAL
  Filled 2017-03-16: qty 56

## 2017-03-16 NOTE — Progress Notes (Signed)
SVD of vigorous female infant w/ apgars of 8,9.  Placenta delivered spontaneous w/ 3VC.   2nd degree midline episiotomy repaired w/ 3-0 vicryl rapide.  Fundus firm.  EBL 200cc .

## 2017-03-16 NOTE — H&P (Signed)
Crystal Humphrey is a 31 y.o. female presenting for SOL.  Pregnancy uncomplicated.  OB History    Gravida Para Term Preterm AB Living   2 1 1     1    SAB TAB Ectopic Multiple Live Births         0 1     Past Medical History:  Diagnosis Date  . Allergy   . Cancer ([place])   . Headache   . Heart palpitations   . PONV (postoperative nausea and vomiting)   . Thyroid nodule    Past Surgical History:  Procedure Laterality Date  . biopsy on thyroid    . SKIN CYST REMOVED  AGE 44  . WISDOM TOOTH EXTRACTION  AGE 61   Family History: family history includes Arthritis in her maternal grandfather; Cancer in her maternal grandmother; Diabetes in her paternal grandfather; Hyperlipidemia in her father; Liver disease in her paternal grandfather. Social History:  reports that  has never smoked. she has never used smokeless tobacco. She reports that she does not drink alcohol or use drugs.     Maternal Diabetes: No Genetic Screening: Normal Maternal Ultrasounds/Referrals: Normal Fetal Ultrasounds or other Referrals:  None Maternal Substance Abuse:  No Significant Maternal Medications:  None Significant Maternal Lab Results:  None Other Comments:  None  ROS History Dilation: 4.5 Effacement (%): 90 Station: -1 Exam by:: Velna Ochs RN Blood pressure 123/70, pulse (!) 59, temperature 97.8 F (36.6 C), temperature source Oral, resp. rate 18. Exam Physical Exam  Gen - NAD Abd - gravid,NT Ext - NT, no edema Cvx 4.5 cm Prenatal labs: ABO, Rh: O/Positive/-- (04/06 0000) Antibody: Negative (04/06 0000) Rubella: Immune (04/06 0000) RPR: Nonreactive (04/06 0000)  HBsAg: Negative (04/06 0000)  HIV: Non-reactive (04/06 0000)  GBS: Negative (10/20 0000)   Assessment/Plan: Admit Epidural prn   Crystal Humphrey 03/16/2017, 8:28 AM

## 2017-03-16 NOTE — Anesthesia Preprocedure Evaluation (Deleted)
Anesthesia Evaluation    Airway        Dental   Pulmonary           Cardiovascular      Neuro/Psych    GI/Hepatic   Endo/Other    Renal/GU      Musculoskeletal   Abdominal   Peds  Hematology plt 236k   Anesthesia Other Findings   Reproductive/Obstetrics                             Anesthesia Physical Anesthesia Plan  ASA: II  Anesthesia Plan: Epidural   Post-op Pain Management:    Induction:   PONV Risk Score and Plan: Treatment may vary due to age or medical condition  Airway Management Planned: Natural Airway  Additional Equipment:   Intra-op Plan:   Post-operative Plan:   Informed Consent:   Plan Discussed with:   Anesthesia Plan Comments:         Anesthesia Quick Evaluation

## 2017-03-16 NOTE — Progress Notes (Signed)
Pt feeling less frequent ctx.    FHT cat 1 Toco Q3-5 Cvx 5/90/-2 AROM - light mec  A/P:  Exp mnt Epidural prn

## 2017-03-16 NOTE — MAU Note (Signed)
Contractions since 130 am; reports every 2-3 minutes apart Denies LOF or VB +FM

## 2017-03-16 NOTE — Anesthesia Pain Management Evaluation Note (Signed)
  CRNA Pain Management Visit Note  Patient: Crystal Humphrey, 31 y.o., female  "Hello I am a member of the anesthesia team at [facility]. We have an anesthesia team available at all times to provide care throughout the hospital, including epidural management and anesthesia for C-section. I don't know your plan for the delivery whether it a natural birth, water birth, IV sedation, nitrous supplementation, doula or epidural, but we want to meet your pain goals."   1.Was your pain managed to your expectations on prior hospitalizations?   Yes   2.What is your expectation for pain management during this hospitalization?     Labor support without medications and Epidural  3.How can we help you reach that goal? Patient may want an epidural. Will determine plan as labor progresses. Patient is aware of all pain control options.  Record the patient's initial score and the patient's pain goal.   Pain: 5  Pain Goal: 7 [facility] wants you to be able to say your pain was always managed very well.  Tymarion Everard L 03/16/2017

## 2017-03-17 LAB — CBC
HEMATOCRIT: 29.1 % — AB (ref 36.0–46.0)
HEMOGLOBIN: 9.8 g/dL — AB (ref 12.0–15.0)
MCH: 31 pg (ref 26.0–34.0)
MCHC: 33.7 g/dL (ref 30.0–36.0)
MCV: 92.1 fL (ref 78.0–100.0)
Platelets: 227 10*3/uL (ref 150–400)
RBC: 3.16 MIL/uL — ABNORMAL LOW (ref 3.87–5.11)
RDW: 14.5 % (ref 11.5–15.5)
WBC: 11.2 10*3/uL — ABNORMAL HIGH (ref 4.0–10.5)

## 2017-03-17 MED ORDER — IBUPROFEN 600 MG PO TABS: 600.0000 mg | ORAL_TABLET | Freq: Four times a day (QID) | ORAL | 0 refills | Status: DC

## 2017-03-17 NOTE — Lactation Note (Signed)
This note was copied from a baby's chart. Lactation Consultation Note: Staff nurse reports that infant is latched on . When I arrived in room infant had released the breast. Mother showed me that her nipple was flat and long when infant released the breast.  Assist mother with football hold. Mother advised to use off sided latch technique. Infant bouncing on and off with multiple attempts to get infant to sustain latch. Mother complaints of infant biting and she removed infant several times. Infant on and off for 15 mins on the left breast.  Infant placed in cross cradle and continued to bounce on and off with wide open mouth. Infant latched for 20 mins on and off with a few swallows observed. Infant continues to bit several times during feeding.  Attempt to hand express and spoon feed infant. Mother unable to tolerate hand expression and only a few drops obtained .   Observed that infant has a high palate. Observed that infant cups his tongue and has a short frenula. Advised mother that infant need a good deep latch. Discussed the use of a nipple shield with mother if infant unable to sustain latch with good depth. Mother declines at this time.  Mother was given a hand pump with a #24 flange. Mother reports that she used #21 flanges with her first child. She was given #21 flanges at her request. Mother was given supplemental guidelines and advised to hand express and spoon feed infant after each feeding. Suggested that she post pump with a hand pump. Discussed the use of a DEBP if needed. Mother will page for assistance.   Patient Name: Crystal Humphrey [id number] Date: 03/17/2017 Reason for consult: Initial assessment   Maternal Data Has patient been taught Hand Expression?: No Does the patient have breastfeeding experience prior to this delivery?: Yes  Feeding Feeding Type: Breast Milk Length of feed: 20 min(on and off with shallow latch)  LATCH Score Latch: Repeated attempts needed to  sustain latch, nipple held in mouth throughout feeding, stimulation needed to elicit sucking reflex.  Audible Swallowing: A few with stimulation  Type of Nipple: Everted at rest and after stimulation  Comfort (Breast/Nipple): Soft / non-tender(infant bitting )  Hold (Positioning): Assistance needed to correctly position infant at breast and maintain latch.  LATCH Score: 7  Interventions Interventions: Breast feeding basics reviewed;Skin to skin  Lactation Tools Discussed/Used     Consult Status Consult Status: Follow-up Date: 03/18/17 Follow-up type: In-patient    Jess Barters [facility] 03/17/2017, 2:42 PM

## 2017-03-17 NOTE — Lactation Note (Signed)
This note was copied from a baby's chart. Lactation Consultation Note: Mother reports that she breastfed her first child for 11 months. Infant is 63 hours old. He was circumcised this am. Infant has had 3 documented feedings with LS of 8 last pm. Mother has attempt 3 times to latch infant in the early morning hours.  Mother reports that infant is very sleepy. Mother has lots of family and sibling in her room. All are eating lunch. Mother was advised to page for [facility] when ready to offer breast. Mother unsure if infant is latching well. Mother reports that her nipples are flat. Mother was given Lactation Brochure and review of basic breastfeeding. Advised mother to so skin to skin to rouse infant and feed infant 8-12 times in 24 hours. Mother informed of available [place] services, [facility], OutPt . Dept and available phone services for breastfeeding questions and concerns.   Patient Name: Crystal Humphrey [id number] Date: 03/17/2017 Reason for consult: Initial assessment   Maternal Data Has patient been taught Hand Expression?: No Does the patient have breastfeeding experience prior to this delivery?: Yes  Feeding    LATCH Score                   Interventions Interventions: Breast feeding basics reviewed;Skin to skin  Lactation Tools Discussed/Used     Consult Status Consult Status: Follow-up Date: 03/17/17 Follow-up type: In-patient    Jess Barters [facility] 03/17/2017, 12:33 PM

## 2017-03-17 NOTE — Discharge Summary (Signed)
Obstetric Discharge Summary Reason for Admission: onset of labor Prenatal Procedures: none Intrapartum Procedures: spontaneous vaginal delivery Postpartum Procedures: none Complications-Operative and Postpartum: none Hemoglobin  Date Value Ref Range Status  03/17/2017 9.8 (L) 12.0 - 15.0 g/dL Final   HCT  Date Value Ref Range Status  03/17/2017 29.1 (L) 36.0 - 46.0 % Final    Physical Exam:  General: alert, cooperative and appears stated age 31: appropriate Uterine Fundus: firm Incision: healing well, no significant drainage, no dehiscence DVT Evaluation: No evidence of DVT seen on physical exam.  Discharge Diagnoses: Term Pregnancy-delivered  Discharge Information: Date: 03/17/2017 Activity: pelvic rest Diet: routine Medications: Ibuprofen Condition: stable Instructions: refer to practice specific booklet Discharge to: home   Newborn Data: Live born female  Birth Weight: 7 lb 7 oz (3374 g) APGAR: 8, 9  Newborn Delivery   Birth date/time:  03/16/2017 14:42:00 Delivery type:  Vaginal, Vacuum (Extractor)     Home with mother.  Elis Sauber L 03/17/2017, 8:39 AM

## 2017-03-20 ENCOUNTER — Inpatient Hospital Stay (HOSPITAL_COMMUNITY): Admission: RE | Admit: 2017-03-20 | Payer: BC Managed Care – PPO | Source: Ambulatory Visit

## 2017-04-13 ENCOUNTER — Other Ambulatory Visit: Payer: BC Managed Care – PPO

## 2017-04-17 ENCOUNTER — Ambulatory Visit
Admission: RE | Admit: 2017-04-17 | Discharge: 2017-04-17 | Disposition: A | Discharge status: Home / Self Care | Payer: BC Managed Care – PPO | Source: Ambulatory Visit | Attending: Surgery | Admitting: Surgery

## 2017-04-17 DIAGNOSIS — Z85850 Personal history of malignant neoplasm of thyroid: Secondary | ICD-10-CM

## 2017-06-05 ENCOUNTER — Ambulatory Visit: Payer: Self-pay | Admitting: *Deleted

## 2017-06-05 NOTE — Telephone Encounter (Signed)
Pt called concerned that she was pumping her left breast at work and got some blood in the milk. She denies pain now but has had pain over the last week off and on in the left breast. She denies discharge or any problems with her nipple.   The baby is 71 weeks old. According to literature the blood is harmless to the infant. And it should last for 3 to 7 days.  Pt voiced understanding.  Appointment made for Saturday Clinic to be seen.   Answer Assessment - Initial Assessment Questions 1. SYMPTOM: "What's the main symptom you're concerned about?"  (e.g., lump, pain, rash, nipple discharge)     Blood in breast milk 2. LOCATION: "Where is the _______ located?"     Left breast 3. ONSET: "When did ________  start?"     today 4. PRIOR HISTORY: "Do you have any history of prior problems with your breasts?" (e.g., lumps, cancer, fibrocystic breast disease)     No lumps 5. CAUSE: "What do you think is causing this symptom?"     no 6. OTHER SYMPTOMS: "Do you have any other symptoms?" (e.g., fever, breast pain, redness or rash, nipple discharge)     Hurting off and on the last week, felt like a tug under her left arm 7. PREGNANCY-BREASTFEEDING: "Is there any chance you are pregnant?" "When was your last menstrual period?" "Are you breastfeeding?"     No baby is 11 weeks. Breastfeeding well  Protocols used: BREAST [facility]

## 2017-06-06 ENCOUNTER — Encounter: Payer: Self-pay | Admitting: Family Medicine

## 2017-06-06 ENCOUNTER — Ambulatory Visit: Payer: BC Managed Care – PPO | Admitting: Family Medicine

## 2017-06-06 ENCOUNTER — Encounter (INDEPENDENT_AMBULATORY_CARE_PROVIDER_SITE_OTHER): Payer: Self-pay

## 2017-06-06 VITALS — BP 118/80 | HR 76 | Temp 98.1°F | Wt 151.0 lb

## 2017-06-06 DIAGNOSIS — N644 Mastodynia: Secondary | ICD-10-CM | POA: Diagnosis not present

## 2017-06-06 NOTE — Progress Notes (Signed)
Dr. Frederico Hamman T. Kimberli Winne, MD, [facility] [place], [address] Phone: [phone] Fax: [phone]  06/06/2017  Patient: Crystal Humphrey, MRN: [id number], DOB: October 30, 1985, 32 y.o.  Primary Physician:  Maryruth Eve, MD   Chief Complaint  Patient presents with  . Blood in Breast Milk    was pumping at work yesterday...left breast tenderness and nipple and breast sensitivity...denies feeling of warmth, rash or fever   Subjective:   Crystal Humphrey is a 32 y.o. very pleasant female patient who presents with the following:  Very pleasant young lady who has a 58-month-old child, second child.  She started to develop some tenderness on the medial aspect of her left breast just medial to the nipple with some breast sensitivity.  There is no redness or warmth at all.  She also developed some blood in the milk yesterday.  Past Medical History, Surgical History, Social History, Family History, Problem List, Medications, and Allergies have been reviewed and updated if relevant.  Patient Active Problem List   Diagnosis Date Noted  . Indication for care in labor or delivery 03/16/2017  . SVD (spontaneous vaginal delivery) 03/16/2017  . Changing skin lesion 03/10/2017  . Palpitations 02/17/2017  . Chest pressure 02/17/2017  . 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 Allergy   . Cancer ([place])   . Headache   . Heart palpitations   . PONV (postoperative nausea and vomiting)   . Thyroid nodule     Past Surgical History:  Procedure Laterality Date  . biopsy on thyroid    . SKIN CYST REMOVED  AGE 73  . THYROIDECTOMY N/A 03/07/2016   Procedure: THYROID ISTHMUSECTOMY;  Surgeon: Armandina Gemma, MD;  Location: [facility];  Service: General;  Laterality: N/A;  . WISDOM TOOTH EXTRACTION  AGE 73    Social History   Socioeconomic History  . Marital status: Married    Spouse name: Not  on file  . Number of children: Not on file  . Years of education: Not on file  . Highest education level: Not on file  Social Needs  . Financial resource strain: Not on file  . Food insecurity - worry: Not on file  . Food insecurity - inability: Not on file  . Transportation needs - medical: Not on file  . Transportation needs - non-medical: Not on file  Occupational History  . Not on file  Tobacco Use  . Smoking status: Never Smoker  . Smokeless tobacco: Never Used  Substance and Sexual Activity  . Alcohol use: No  . Drug use: No  . Sexual activity: Yes  Other Topics Concern  . Not on file  Social History Narrative   82 year old boy, expecting 03/2017      Music teacher- [employer]       Family History  Problem Relation Age of Onset  . Hyperlipidemia Father   . Cancer Maternal Grandmother        Breast  . Arthritis Maternal Grandfather   . Diabetes Paternal Grandfather   . Liver disease Paternal Grandfather     No Known Allergies  Medication list reviewed and updated in full in [place].   GEN: No acute illnesses, no fevers, chills. GI: No n/v/d, eating normally Pulm: No SOB Interactive and getting along well at home.  Otherwise, ROS is as per the HPI.  Objective:   BP 118/80  Pulse 76   Temp 98.1 F (36.7 C) (Oral)   Wt 151 lb (68.5 kg)   SpO2 98%   BMI 25.13 kg/m   GEN: WDWN, NAD, Non-toxic, A & O x 3 HEENT: Atraumatic, Normocephalic. Neck supple. No masses, No LAD. Ears and Nose: No external deformity. Examination chaperoned by Kathrynn Running, CMA Breast exam on the left shows no lymphadenopathy in the axillary region.  The nipple is mildly dry, reddened, and irritated in appearance.  Medial to the nipple there is some tenderness in the breast tissue without mass. EXTR: No c/c/e NEURO Normal gait.  PSYCH: Normally interactive. Conversant. Not depressed or anxious appearing.  Calm demeanor.   Laboratory and Imaging  Data:  Assessment and Plan:   Breast pain, left  Mild breast pain on the left, medially with some small amount of blood in the milk.  It is okay to continue to breast-feed the child.  Recommended using some lanolin a couple times a day, using some cold nipple pads for at least the rest of the week.  Likely this will spontaneously resolved in short order.  Patient Instructions  Lansinoh nursing pads - put them in the freezer and put in your nursing bra  Apply some lanolin cream to the nipple twice a day - small amount      Follow-up: No Follow-up on file.  Signed,  Maud Deed. Mylik Pro, MD   Allergies as of 06/06/2017   No Known Allergies     Medication List        Accurate as of 06/06/17 12:25 PM. Always use your most recent med list.          norethindrone 0.35 MG tablet Commonly known as:  MICRONOR,CAMILA,ERRIN

## 2017-06-06 NOTE — Patient Instructions (Addendum)
Lansinoh nursing pads - put them in the freezer and put in your nursing bra  Apply some lanolin cream to the nipple twice a day - small amount

## 2017-06-24 ENCOUNTER — Ambulatory Visit: Payer: BC Managed Care – PPO | Admitting: Family Medicine

## 2017-06-24 ENCOUNTER — Encounter: Payer: Self-pay | Admitting: Family Medicine

## 2017-06-24 VITALS — BP 106/66 | HR 71 | Temp 98.7°F | Wt 152.0 lb

## 2017-06-24 DIAGNOSIS — M791 Myalgia, unspecified site: Secondary | ICD-10-CM

## 2017-06-24 DIAGNOSIS — R509 Fever, unspecified: Secondary | ICD-10-CM

## 2017-06-24 DIAGNOSIS — J069 Acute upper respiratory infection, unspecified: Secondary | ICD-10-CM

## 2017-06-24 DIAGNOSIS — B9789 Other viral agents as the cause of diseases classified elsewhere: Secondary | ICD-10-CM | POA: Diagnosis not present

## 2017-06-24 LAB — POCT INFLUENZA A/B
INFLUENZA A, POC: NEGATIVE
Influenza B, POC: NEGATIVE

## 2017-06-24 NOTE — Patient Instructions (Signed)
For nasal congestion you can use Afrin nasal spray for 3 days max, Sudafed, saline nasal spray (generic is fine for all). For cough you can try Delsym. Drink enough fluids to make your urine light yellow. For fever/chill/muscle aches you can take over the counter acetaminophen or ibuprofen.  Please come back in if you are not better in 5-7 days or if you develop wheezing, shortness of breath or persistent vomiting.   

## 2017-06-24 NOTE — Progress Notes (Signed)
Subjective:    Patient ID: Crystal Humphrey, female    DOB: Dec 02, 1985, 32 y.o.   MRN: [id number]  HPI This is a 32 yo female, accompanied by her husband and two children, who presents today with fever and nasal congestion x 3 days. Fever to 101.2. Did not have flu shot this year. Some productive cough, nasal drainage is clear, watery. No SOB, no wheeze,  Has been taking acetaminophen, Zyrtec and Robitussin with some relief.  Son had flu last week.   Past Medical History:  Diagnosis Date  . Allergy   . Cancer ([place])   . Headache   . Heart palpitations   . PONV (postoperative nausea and vomiting)   . Thyroid nodule    Past Surgical History:  Procedure Laterality Date  . biopsy on thyroid    . SKIN CYST REMOVED  AGE 26  . THYROIDECTOMY N/A 03/07/2016   Procedure: THYROID ISTHMUSECTOMY;  Surgeon: Armandina Gemma, MD;  Location: [facility];  Service: General;  Laterality: N/A;  . WISDOM TOOTH EXTRACTION  AGE 26   Family History  Problem Relation Age of Onset  . Hyperlipidemia Father   . Cancer Maternal Grandmother        Breast  . Arthritis Maternal Grandfather   . Diabetes Paternal Grandfather   . Liver disease Paternal Grandfather    Social History   Tobacco Use  . Smoking status: Never Smoker  . Smokeless tobacco: Never Used  Substance Use Topics  . Alcohol use: No  . Drug use: No      Review of Systems Per HPI    Objective:   Physical Exam  Constitutional: She is oriented to person, place, and time. She appears well-developed and well-nourished. No distress.  HENT:  Head: Normocephalic and atraumatic.  Right Ear: Tympanic membrane, external ear and ear canal normal.  Left Ear: Tympanic membrane, external ear and ear canal normal.  Nose: Mucosal edema and rhinorrhea present.  Mouth/Throat: Uvula is midline and mucous membranes are normal. Posterior oropharyngeal erythema present. No oropharyngeal exudate.  Small blister on posterior pharynx.   Eyes: Conjunctivae are  normal.  Neck: Normal range of motion. Neck supple.  Cardiovascular: Normal rate, regular rhythm and normal heart sounds.  Pulmonary/Chest: Effort normal and breath sounds normal.  Lymphadenopathy:    She has no cervical adenopathy.  Neurological: She is alert and oriented to person, place, and time.  Skin: Skin is warm and dry. She is not diaphoretic.  Psychiatric: She has a normal mood and affect. Her behavior is normal. Judgment and thought content normal.  Vitals reviewed.     BP 106/66   Pulse 71   Temp 98.7 F (37.1 C) (Oral)   Wt 152 lb (68.9 kg)   SpO2 97%   Breastfeeding? Yes   BMI 25.29 kg/m  POCT influenza- negative    Assessment & Plan:  1. Fever, unspecified fever cause - POCT Influenza A/B  2. Myalgia - POCT Influenza A/B  3. Viral URI with cough - Provided written and verbal information regarding diagnosis and treatment. -  Patient Instructions  For nasal congestion you can use Afrin nasal spray for 3 days max, Sudafed, saline nasal spray (generic is fine for all). For cough you can try Delsym. Drink enough fluids to make your urine light yellow. For fever/chill/muscle aches you can take over the counter acetaminophen or ibuprofen.  Please come back in if you are not better in 5-7 days or if you develop wheezing, shortness of breath  or persistent vomiting.   - She asked if Aleve was safe while breastfeeding, discussed using short term for a couple of days, but that ibuprofen preferred.   Clarene Reamer, FNP-BC  [facility], [facility]  06/24/2017 11:30 AM

## 2018-03-19 ENCOUNTER — Ambulatory Visit: Payer: Self-pay | Admitting: *Deleted

## 2018-03-19 NOTE — Telephone Encounter (Signed)
Patient is calling to report cough that is not improving- she has tried to wait it out and she thought it would go away- but it has not. 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 Patient has history of pneumonia and she does not want it again- she has gotten sore ribs from all the coughing.  Appointment scheduled.  Reason for Disposition . 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  Answer Assessment - Initial Assessment Questions 1. ONSET: "When did the cough begin?"      2-3 weeks 2. SEVERITY: "How bad is the cough today?"      Worse at night and better in day 3. RESPIRATORY DISTRESS: "Describe your breathing."      Not effecting breathing- mucus is moving 4. FEVER: "Do you have a fever?" If so, ask: "What is your temperature, how was it measured, and when did it start?"     No fever 5. SPUTUM: "Describe the color of your sputum" (clear, white, yellow, green)     Feels in throat- but not coming up- yellow when blows nose 6. HEMOPTYSIS: "Are you coughing up any blood?" If so ask: "How much?" (flecks, streaks, tablespoons, etc.)     no 7. CARDIAC HISTORY: "Do you have any history of heart disease?" (e.g., heart attack, congestive heart failure)      no 8. LUNG HISTORY: "Do you have any history of lung disease?"  (e.g., pulmonary embolus, asthma, emphysema)     no 9. PE RISK FACTORS: "Do you have a history of blood clots?" (or: recent major surgery, recent prolonged travel, bedridden)     no 10. OTHER SYMPTOMS: "Do you have any other symptoms?" (e.g., runny nose, wheezing, chest pain)       Sore muscular at ribs, rattles when breaths, congestion in sinuses 11. PREGNANCY: "Is there any chance you are pregnant?" "When was your last menstrual period?"       No- POP 12. TRAVEL: "Have you traveled out of the country in the last month?" (e.g., travel history, exposures)  n/a  Protocols used: [place]

## 2018-03-20 ENCOUNTER — Ambulatory Visit: Payer: BC Managed Care – PPO | Admitting: Family Medicine

## 2018-03-20 ENCOUNTER — Encounter: Payer: Self-pay | Admitting: Family Medicine

## 2018-03-20 VITALS — BP 106/70 | HR 82 | Temp 98.2°F | Wt 137.0 lb

## 2018-03-20 DIAGNOSIS — J019 Acute sinusitis, unspecified: Secondary | ICD-10-CM | POA: Insufficient documentation

## 2018-03-20 DIAGNOSIS — J22 Unspecified acute lower respiratory infection: Secondary | ICD-10-CM | POA: Diagnosis not present

## 2018-03-20 MED ORDER — DOXYCYCLINE HYCLATE 100 MG PO TABS: 100.0000 mg | ORAL_TABLET | Freq: Two times a day (BID) | ORAL | 0 refills | Status: DC

## 2018-03-20 MED ORDER — GUAIFENESIN-CODEINE 100-10 MG/5ML PO SYRP
5.0000 mL | ORAL_SOLUTION | Freq: Every evening | ORAL | 0 refills | Status: DC | PRN
Start: 2018-03-20 — End: 2019-04-27

## 2018-03-20 MED ORDER — AZITHROMYCIN 250 MG PO TABS: ORAL_TABLET | ORAL | 0 refills | Status: DC

## 2018-03-20 NOTE — Assessment & Plan Note (Signed)
Anticipate recurrent viral infections. Supportive care reviewed - rec ibuprofen, cheratussin for cough, fluids, rest. Discussed red flags for bacterial superinfection after viral URI - and provided with WASP for doxycycline to fill in case this happens. Pt agrees with plan.  Discussed needs to avoid breastfeeding with doxycycline or codeine cough syrup - she states she is finishing breast feeding this week.

## 2018-03-20 NOTE — Progress Notes (Signed)
BP 106/70   Pulse 82   Temp 98.2 F (36.8 C) (Oral)   Wt 137 lb (62.1 kg)   SpO2 98%   BMI 22.80 kg/m    CC: cough, congestion Subjective:    Patient ID: Crystal Humphrey, female    DOB: 1985/08/05, 32 y.o.   MRN: [id number]  HPI: Crystal Humphrey is a 32 y.o. female presenting on 03/20/2018 for Cough (x 2-3 weeks.... mostly nonproductive.... pt has not been taking OTC meds in the past week) and Nasal Congestion   2 mo h/o off and on illness, gets better then symptoms recur. Over last 3 weeks noticing malaise, fever, congestion, cough and post nasal drainage. Last week started feeling better, but again cough worsened 1 wk ago. L lateral chest wall aching from cough. Cough is productive of mucous. Chest > head congestion   No fevers/chills, ear or tooth pain, dyspnea or wheezing.   Tried delsym, cough drops, vitamins.  Children (1yo, 3yo) intermittently sick as well.  No smokers at home No h/o asthma.   Relevant past medical, surgical, family and social history reviewed and updated as indicated. Interim medical history since our last visit reviewed. Allergies and medications reviewed and updated. Outpatient Medications Prior to Visit  Medication Sig Dispense Refill  . norethindrone (MICRONOR,CAMILA,ERRIN) 0.35 MG tablet   1   No facility-administered medications prior to visit.      Per HPI unless specifically indicated in ROS section below Review of Systems     Objective:    BP 106/70   Pulse 82   Temp 98.2 F (36.8 C) (Oral)   Wt 137 lb (62.1 kg)   SpO2 98%   BMI 22.80 kg/m   Wt Readings from Last 3 Encounters:  03/20/18 137 lb (62.1 kg)  06/24/17 152 lb (68.9 kg)  06/06/17 151 lb (68.5 kg)    Physical Exam  Constitutional: She appears well-developed and well-nourished. No distress.  HENT:  Head: Normocephalic and atraumatic.  Right Ear: Hearing, tympanic membrane, external ear and ear canal normal.  Left Ear: Hearing, tympanic membrane, external ear and  ear canal normal.  Nose: Mucosal edema (nasal mucosal congestion and injection R>L) and rhinorrhea present. Right sinus exhibits no maxillary sinus tenderness and no frontal sinus tenderness. Left sinus exhibits no maxillary sinus tenderness and no frontal sinus tenderness.  Mouth/Throat: Uvula is midline, oropharynx is clear and moist and mucous membranes are normal. No oropharyngeal exudate, posterior oropharyngeal edema, posterior oropharyngeal erythema or tonsillar abscesses.  Eyes: Pupils are equal, round, and reactive to light. Conjunctivae and EOM are normal. No scleral icterus.  Neck: Normal range of motion. Neck supple.  Cardiovascular: Normal rate, regular rhythm, normal heart sounds and intact distal pulses.  No murmur heard. Pulmonary/Chest: Effort normal and breath sounds normal. No respiratory distress. She has no wheezes. She has no rales. She exhibits tenderness (L lateral ribcage tenderness to palpation).  Lungs clear  Lymphadenopathy:    She has no cervical adenopathy.  Skin: Skin is warm and dry. No rash noted.  Nursing note and vitals reviewed.     Assessment & Plan:   Problem List Items Addressed This Visit    Acute respiratory infection - Primary    Anticipate recurrent viral infections. Supportive care reviewed - rec ibuprofen, cheratussin for cough, fluids, rest. Discussed red flags for bacterial superinfection after viral URI - and provided with WASP for doxycycline to fill in case this happens. Pt agrees with plan.  Discussed needs to avoid breastfeeding with  doxycycline or codeine cough syrup - she states she is finishing breast feeding this week.           Meds ordered this encounter  Medications  . DISCONTD: azithromycin (ZITHROMAX) 250 MG tablet    Sig: Take two tablets on day one followed by one tablet on days 2-5    Dispense:  6 each    Refill:  0  . doxycycline (VIBRA-TABS) 100 MG tablet    Sig: Take 1 tablet (100 mg total) by mouth 2 (two) times  daily.    Dispense:  14 tablet    Refill:  0  . guaiFENesin-codeine (CHERATUSSIN AC) 100-10 MG/5ML syrup    Sig: Take 5 mLs by mouth at bedtime as needed for cough (sedation precautions).    Dispense:  120 mL    Refill:  0   No orders of the defined types were placed in this encounter.   Follow up plan: No follow-ups on file.  Crystal Bush, MD

## 2018-03-20 NOTE — Patient Instructions (Signed)
You have a persistent and recurrent respiratory infections, likely viral. Viral infections usually take 7-10 days to resolve. The cough can last a few weeks to go away.  Use medication as prescribed: codeine cough syrup for night time Take ibuprofen 400--600mg  with meals for inflammation.  Push fluids and plenty of rest. Fill antibiotic printed out today if any fever >101, worsening productive cough, or worsening one sided facial pain/pressure.  Call clinic with questions.  Good to see you today. I hope you start feeling better soon.

## 2018-03-30 ENCOUNTER — Other Ambulatory Visit: Payer: Self-pay | Admitting: Surgery

## 2018-03-30 DIAGNOSIS — E041 Nontoxic single thyroid nodule: Secondary | ICD-10-CM

## 2018-03-30 DIAGNOSIS — Z85850 Personal history of malignant neoplasm of thyroid: Secondary | ICD-10-CM

## 2018-03-30 DIAGNOSIS — C73 Malignant neoplasm of thyroid gland: Secondary | ICD-10-CM

## 2018-05-03 ENCOUNTER — Ambulatory Visit
Admission: RE | Admit: 2018-05-03 | Discharge: 2018-05-03 | Disposition: A | Discharge status: Home / Self Care | Payer: BC Managed Care – PPO | Source: Ambulatory Visit | Attending: Surgery | Admitting: Surgery

## 2018-05-03 DIAGNOSIS — C73 Malignant neoplasm of thyroid gland: Secondary | ICD-10-CM

## 2018-05-03 DIAGNOSIS — E041 Nontoxic single thyroid nodule: Secondary | ICD-10-CM

## 2018-05-03 DIAGNOSIS — Z85850 Personal history of malignant neoplasm of thyroid: Secondary | ICD-10-CM

## 2018-09-13 ENCOUNTER — Other Ambulatory Visit: Payer: Self-pay | Admitting: Surgery

## 2019-01-06 ENCOUNTER — Ambulatory Visit: Payer: Self-pay | Admitting: *Deleted

## 2019-01-06 NOTE — Telephone Encounter (Signed)
Patient is calling to report- fatigue and heart feeling off-"like has had too much coffee". Feeling better today than yesterday.No fever present. Patient just states she feels off. Very vague with symptom description she is having a hard time describing. Call to office- they have not seen patient in some time- for today we are going to recommend UC and follow up in office. Patient has been made aware of recommendation.  Reason for Disposition . History of hyperthyroidism or taking thyroid medication    Patient is very vague with symptom description and although she states she feels some better today-advised UC for evaluation. She is advised to make follow up appointment at office. Patient states understanding- but not sure if she will go.  Answer Assessment - Initial Assessment Questions 1. DESCRIPTION: "Please describe your heart rate or heart beat that you are having" (e.g., fast/slow, regular/irregular, skipped or extra beats, "palpitations")     Feels that her heart rate is off- not feeling well yesterday- fatigue 2. ONSET: "When did it start?" (Minutes, hours or days)      yesterday 3. DURATION: "How long does it last" (e.g., seconds, minutes, hours)     No irregular beat 4. PATTERN "Does it come and go, or has it been constant since it started?"  "Does it get worse with exertion?"   "Are you feeling it now?"     Almost feels like she has been running 5. TAP: "Using your hand, can you tap out what you are feeling on a chair or table in front of you, so that I can hear?" (Note: not all patients can do this)       regular 6. HEART RATE: "Can you tell me your heart rate?" "How many beats in 15 seconds?"  (Note: not all patients can do this)       74 7. RECURRENT SYMPTOM: "Have you ever had this before?" If so, ask: "When was the last time?" and "What happened that time?"      Mostly fatigue  Protocols used: HEART RATE AND HEARTBEAT QUESTIONS-A-AH

## 2019-01-06 NOTE — Telephone Encounter (Signed)
LMTCB to see if we can have virtual with patient or see if she went to [facility] for symptoms.

## 2019-01-06 NOTE — Telephone Encounter (Signed)
Summary: Call back request for Crystal Humphrey   Pt called and stated that she figured it out and will not need a call.  ----- Message from Erick Blinks sent at 01/06/2019 12:04 PM EDT -----  Best contactSQ:[phone]  Pt's called requesting to speak with RN Opal Sidles specifically, she has already been triaged and would like to ask Opal Sidles a question before she goes to urgent care. Please advise      Attempted to call patient to see what had caused her to feel bad- left message for her that I was interested in what she had figured out as the cause.

## 2019-01-11 ENCOUNTER — Other Ambulatory Visit: Payer: Self-pay

## 2019-01-11 DIAGNOSIS — Z20822 Contact with and (suspected) exposure to covid-19: Secondary | ICD-10-CM

## 2019-01-13 LAB — NOVEL CORONAVIRUS, NAA: SARS-CoV-2, NAA: NOT DETECTED

## 2019-04-12 ENCOUNTER — Telehealth: Payer: Self-pay | Admitting: *Deleted

## 2019-04-12 NOTE — Telephone Encounter (Signed)
Please find out what symptoms she is having. I will not be able to complete an in person visit though we can get labs on her with a virtual visit and she would not be charged for a second office visit. Thanks.

## 2019-04-12 NOTE — Telephone Encounter (Signed)
I left a message for the patient informing her that she could call back to schedule labs and she would only be charged for the labs that were drawn not an office visit.  Awaiting a call back to schedule.  [name],cma

## 2019-04-12 NOTE — Telephone Encounter (Signed)
Copied from [place] [id number]. Topic: General - Inquiry >> Apr 12, 2019 12:32 PM [place], [place] D wrote: Reason for CRM: Pt is requesting to be seen in person due to her symptoms and possibly needing to have lab work done. Advised of why appts have been changed to virtual. Pt feels very strongly about coming into the office but in the event she cannot she wants to make sure if she has to have any lab work she won't be charged for another OV. Requesting CB either way. Please advise

## 2019-04-12 NOTE — Telephone Encounter (Signed)
Pt is requesting to be seen in person due to her symptoms and possibly needing to have lab work done. Advised of why appts have been changed to virtual. Pt feels very strongly about coming into the office but in the event she cannot she wants to make sure if she has to have any lab work she won't be charged for another OV. I already called patient awaitng a call back to let me know her symptoms.  Nina,cma

## 2019-04-13 ENCOUNTER — Ambulatory Visit (INDEPENDENT_AMBULATORY_CARE_PROVIDER_SITE_OTHER): Payer: BC Managed Care – PPO | Admitting: Family Medicine

## 2019-04-13 ENCOUNTER — Encounter: Payer: Self-pay | Admitting: Family Medicine

## 2019-04-13 ENCOUNTER — Other Ambulatory Visit: Payer: Self-pay

## 2019-04-13 VITALS — Ht 65.0 in | Wt 139.0 lb

## 2019-04-13 DIAGNOSIS — R55 Syncope and collapse: Secondary | ICD-10-CM

## 2019-04-13 NOTE — Progress Notes (Signed)
Virtual Visit via video Note  This visit type was conducted due to national recommendations for restrictions regarding the COVID-19 pandemic (e.g. social distancing).  This format is felt to be most appropriate for this patient at this time.  All issues noted in this document were discussed and addressed.  No physical exam was performed (except for noted visual exam findings with Video Visits).   I connected with Crystal Humphrey today at  3:30 PM EST by a video enabled telemedicine application and verified that I am speaking with the correct person using two identifiers. Location patient: work Location provider: home office Persons participating in the virtual visit: patient, provider  I discussed the limitations, risks, security and privacy concerns of performing an evaluation and management service by telephone and the availability of in person appointments. I also discussed with the patient that there may be a patient responsible charge related to this service. The patient expressed understanding and agreed to proceed.   Reason for visit: Same day visit.  HPI: Syncope: Patient reports a history of syncope in the past. Notes over the last couple of months she has had some issues where she felt as though she would go into a dreamlike state mentally and felt as though she was going to pass out mentally though not physically.  She notes this has occurred on several occasions.  She was able to think and knew what was going on when this occurred.  First occurred 2 months ago.  Last occurred 2 weeks ago.  She notes one episode several weeks ago where she did feel as though she passed out.  She felt faint while she was riding in the car and feels as though she passed out for a minute or so.  She notes no shortness of breath.  She does note occasional palpitations.  She reports an occasional mild tightness in her chest.  She notes occasional lightheadedness when she goes to stand up.  She does note she  had a stomach bug 3 or 4 weeks ago with some diarrhea though she was having these symptoms prior to the diarrhea.  She notes no shaking episodes or seizure activity.  No postictal phase.  No incontinence.  She does not check her blood pressure.  No anxiety or depression.  She notes both her mother and maternal grandmother are fingers as well.  She does have a history of thyroid cancer though is not on thyroid medication.  She notes no numbness or weakness.  No fevers.  She has seen cardiology in the past and they did discuss syncopal episodes and palpitations.  They noted her syncopal episodes likely represented vasovagal episodes.  She notes no significant triggers for these episodes.   ROS: See pertinent positives and negatives per HPI.  Past Medical History:  Diagnosis Date  . Allergy   . Cancer ([place])   . Headache   . Heart palpitations   . PONV (postoperative nausea and vomiting)   . Thyroid nodule     Past Surgical History:  Procedure Laterality Date  . biopsy on thyroid    . SKIN CYST REMOVED  AGE 15  . THYROIDECTOMY N/A 03/07/2016   Procedure: THYROID ISTHMUSECTOMY;  Surgeon: Armandina Gemma, MD;  Location: [facility];  Service: General;  Laterality: N/A;  . WISDOM TOOTH EXTRACTION  AGE 15    Family History  Problem Relation Age of Onset  . Hyperlipidemia Father   . Cancer Maternal Grandmother        Breast  .  Arthritis Maternal Grandfather   . Diabetes Paternal Grandfather   . Liver disease Paternal Grandfather     SOCIAL HX: Non-smoker   Current Outpatient Medications:  Marland Kitchen  Multiple Vitamin (MULTIVITAMIN WITH MINERALS) TABS tablet, Take 1 tablet by mouth daily., Disp: , Rfl:  .  TRI-SPRINTEC 0.18/0.215/0.25 MG-35 MCG tablet, Take 1 tablet by mouth daily., Disp: , Rfl:  .  doxycycline (VIBRA-TABS) 100 MG tablet, Take 1 tablet (100 mg total) by mouth 2 (two) times daily. (Patient not taking: Reported on 04/13/2019), Disp: 14 tablet, Rfl: 0 .  guaiFENesin-codeine (CHERATUSSIN AC)  100-10 MG/5ML syrup, Take 5 mLs by mouth at bedtime as needed for cough (sedation precautions). (Patient not taking: Reported on 04/13/2019), Disp: 120 mL, Rfl: 0 .  norethindrone (MICRONOR,CAMILA,ERRIN) 0.35 MG tablet, , Disp: , Rfl: 1  EXAM:  VITALS per patient if applicable: None.  GENERAL: alert, oriented, appears well and in no acute distress  HEENT: atraumatic, conjunttiva clear, no obvious abnormalities on inspection of external nose and ears  NECK: normal movements of the head and neck  LUNGS: on inspection no signs of respiratory distress, breathing rate appears normal, no obvious gross SOB, gasping or wheezing  CV: no obvious cyanosis  MS: moves all visible extremities without noticeable abnormality  PSYCH/NEURO: pleasant and cooperative, no obvious depression or anxiety, speech and thought processing grossly intact  ASSESSMENT AND PLAN:  Discussed the following assessment and plan:  Syncope New issue to me. Patient with symptoms consistent with presyncope/syncope.  Undetermined cause at this time of though could represent orthostasis given lightheadedness when she goes to stand up versus a vasovagal episode as she has had those previously.  This does not seem consistent with seizure activity.  Discussed initial evaluation would include lab work and an EKG.  Discussed the potential need to see cardiology.  We will get her scheduled for an EKG and lab work in the office.  Advised that if she has any persistent palpitations or she develops chest pain she needs to be evaluated in person.  Discussed that if she has another syncopal episode she needs to go to the emergency room for evaluation.    I discussed the assessment and treatment plan with the patient. The patient was provided an opportunity to ask questions and all were answered. The patient agreed with the plan and demonstrated an understanding of the instructions.   The patient was advised to call back or seek an  in-person evaluation if the symptoms worsen or if the condition fails to improve as anticipated.   Tommi Rumps, MD

## 2019-04-13 NOTE — Telephone Encounter (Signed)
Lmtcb.  To discuss symptoms and labs.  Nina,cma

## 2019-04-14 ENCOUNTER — Telehealth: Payer: Self-pay | Admitting: Family Medicine

## 2019-04-14 DIAGNOSIS — R55 Syncope and collapse: Secondary | ICD-10-CM | POA: Insufficient documentation

## 2019-04-14 NOTE — Assessment & Plan Note (Addendum)
New issue to me. Patient with symptoms consistent with presyncope/syncope.  Undetermined cause at this time of though could represent orthostasis given lightheadedness when she goes to stand up versus a vasovagal episode as she has had those previously.  This does not seem consistent with seizure activity.  Discussed initial evaluation would include lab work and an EKG.  Discussed the potential need to see cardiology.  We will get her scheduled for an EKG and lab work in the office.  Advised that if she has any persistent palpitations or she develops chest pain she needs to be evaluated in person.  Discussed that if she has another syncopal episode she needs to go to the emergency room for evaluation.

## 2019-04-14 NOTE — Telephone Encounter (Signed)
Patient needs a nurse visit for an EKG and a lab appointment for lab work. I would like for these to be completed in the next week. Thanks.

## 2019-04-15 ENCOUNTER — Ambulatory Visit: Payer: BC Managed Care – PPO | Admitting: *Deleted

## 2019-04-15 ENCOUNTER — Other Ambulatory Visit (INDEPENDENT_AMBULATORY_CARE_PROVIDER_SITE_OTHER): Payer: BC Managed Care – PPO

## 2019-04-15 ENCOUNTER — Other Ambulatory Visit: Payer: Self-pay

## 2019-04-15 VITALS — BP 105/67 | HR 56

## 2019-04-15 DIAGNOSIS — R55 Syncope and collapse: Secondary | ICD-10-CM | POA: Diagnosis not present

## 2019-04-15 NOTE — Addendum Note (Signed)
Addended by: Zannie Cove on: 04/15/2019 04:59 PM   Modules accepted: Orders

## 2019-04-15 NOTE — Progress Notes (Signed)
EKG had to be done new machine due to [facility] down had Dr. Derrel Nip look at ekg ok to go home per Dr. Derrel Nip.  Placed EKG on provider desk to view on Monday in office.

## 2019-04-16 LAB — T3, FREE: T3, Free: 2.9 pg/mL (ref 2.3–4.2)

## 2019-04-16 LAB — CBC WITH DIFFERENTIAL/PLATELET
Absolute Monocytes: 419 cells/uL (ref 200–950)
Basophils Absolute: 11 cells/uL (ref 0–200)
Basophils Relative: 0.2 %
Eosinophils Absolute: 58 cells/uL (ref 15–500)
Eosinophils Relative: 1.1 %
HCT: 34.7 % — ABNORMAL LOW (ref 35.0–45.0)
Hemoglobin: 12 g/dL (ref 11.7–15.5)
Lymphs Abs: 2571 cells/uL (ref 850–3900)
MCH: 30.5 pg (ref 27.0–33.0)
MCHC: 34.6 g/dL (ref 32.0–36.0)
MCV: 88.3 fL (ref 80.0–100.0)
MPV: 9.6 fL (ref 7.5–12.5)
Monocytes Relative: 7.9 %
Neutro Abs: 2242 cells/uL (ref 1500–7800)
Neutrophils Relative %: 42.3 %
Platelets: 285 10*3/uL (ref 140–400)
RBC: 3.93 10*6/uL (ref 3.80–5.10)
RDW: 12.3 % (ref 11.0–15.0)
Total Lymphocyte: 48.5 %
WBC: 5.3 10*3/uL (ref 3.8–10.8)

## 2019-04-16 LAB — COMPREHENSIVE METABOLIC PANEL
AG Ratio: 1.5 (calc) (ref 1.0–2.5)
ALT: 10 U/L (ref 6–29)
AST: 14 U/L (ref 10–30)
Albumin: 4.1 g/dL (ref 3.6–5.1)
Alkaline phosphatase (APISO): 39 U/L (ref 31–125)
BUN: 14 mg/dL (ref 7–25)
CO2: 22 mmol/L (ref 20–32)
Calcium: 9 mg/dL (ref 8.6–10.2)
Chloride: 102 mmol/L (ref 98–110)
Creat: 0.89 mg/dL (ref 0.50–1.10)
Globulin: 2.7 g/dL (calc) (ref 1.9–3.7)
Glucose, Bld: 107 mg/dL — ABNORMAL HIGH (ref 65–99)
Potassium: 3.9 mmol/L (ref 3.5–5.3)
Sodium: 135 mmol/L (ref 135–146)
Total Bilirubin: 0.3 mg/dL (ref 0.2–1.2)
Total Protein: 6.8 g/dL (ref 6.1–8.1)

## 2019-04-16 LAB — VITAMIN B12: Vitamin B-12: 447 pg/mL (ref 200–1100)

## 2019-04-16 LAB — TSH: TSH: 2.44 mIU/L

## 2019-04-16 LAB — T4, FREE: Free T4: 0.9 ng/dL (ref 0.8–1.8)

## 2019-04-20 NOTE — Telephone Encounter (Signed)
Orders are needed to schedule labs. Please and thank you!

## 2019-04-20 NOTE — Telephone Encounter (Signed)
She already came in to have these things completed. Please disregard the prior message.

## 2019-04-21 NOTE — Telephone Encounter (Signed)
Good afternoon Dr Caryl Bis,  [facility] Thank you!

## 2019-04-25 ENCOUNTER — Telehealth: Payer: Self-pay

## 2019-04-25 DIAGNOSIS — R55 Syncope and collapse: Secondary | ICD-10-CM

## 2019-04-25 NOTE — Telephone Encounter (Signed)
FYI  I placed cardiology referral

## 2019-04-27 ENCOUNTER — Encounter: Payer: Self-pay | Admitting: Family

## 2019-04-27 ENCOUNTER — Ambulatory Visit (INDEPENDENT_AMBULATORY_CARE_PROVIDER_SITE_OTHER): Payer: BC Managed Care – PPO | Admitting: Family

## 2019-04-27 ENCOUNTER — Other Ambulatory Visit: Payer: Self-pay

## 2019-04-27 VITALS — BP 113/69 | HR 81 | Ht 65.5 in | Wt 143.8 lb

## 2019-04-27 DIAGNOSIS — R002 Palpitations: Secondary | ICD-10-CM | POA: Diagnosis not present

## 2019-04-27 DIAGNOSIS — R55 Syncope and collapse: Secondary | ICD-10-CM

## 2019-04-27 DIAGNOSIS — I951 Orthostatic hypotension: Secondary | ICD-10-CM

## 2019-04-27 NOTE — Progress Notes (Signed)
Office Visit    Patient Name: Crystal Humphrey Date of Encounter: 04/27/2019  Primary Care Provider:  Burnard Hawthorne, FNP Primary Cardiologist:  Crystal Rogue, MD Electrophysiologist:  None   Chief Complaint    Crystal Humphrey is a 33 y.o. female with a hx of syncope, palpitations, thyroid cancer s/p thyroidectomy presents today for syncope  Past Medical History    Past Medical History:  Diagnosis Date  . Allergy   . Cancer ([place])   . Headache   . Heart palpitations   . PONV (postoperative nausea and vomiting)   . Thyroid nodule    Past Surgical History:  Procedure Laterality Date  . biopsy on thyroid    . SKIN CYST REMOVED  AGE 23  . THYROIDECTOMY N/A 03/07/2016   Procedure: THYROID ISTHMUSECTOMY;  Surgeon: Crystal Gemma, MD;  Location: [facility];  Service: General;  Laterality: N/A;  . WISDOM TOOTH EXTRACTION  AGE 23   Allergies  No Known Allergies  History of Present Illness    Crystal Humphrey is a 33 y.o. female with a hx of syncope, palpitations, thyroid cancer s/p thyroidectomy last seen by Dr. Kennyth Humphrey on 02/17/2017.  She presents today for evaluation of syncope at request of her PCP.  At previous office visit her near syncope and syncopal symptoms were noted to be suggestive of vasovagal syncope.  She was offered a monitor but declined.  Labs 04/15/2019 B with normal electrolytes, normal kidney/liver function, hemoglobin 12, normal thyroid function.  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 Describes mental "shift" and felt she had a "dreamlike" state. She reports she was cognitive of what was going on. Reports she never physically felt anything. It was "mentally as if I was about to pass out or coming too". Happened again in November while cooking for Thanksgiving. Does report feeling faint that time physically - sat down and it resolved. Happened 3-4x and she passed out  once.   Normally faints when she is pregnant, is ill, or injured. Reports these episodes were different.   Has been having some palpitations. Noted hx for many years. Happens mostly when laying down. More noticeable when she is tired.  They are not associated with pain or shortness of breath.  Drinks 1 cup of coffee in the morning - recently switched to full caffeine instead of half-caffeinated.  Reports no change in her symptoms with switch. She does not drink etoh nor smoke.  She takes no over-the-counter nonprescribed proarrhythmic medication.  Tells me her mom had to wear a heart monitor. This was due to syncopal episodes.  She reports no chest pain, pressure, tightness.  She reports no edema, PND, orthopnea.  EKGs/Labs/Other Studies Reviewed:   The following studies were reviewed today:  EKG:  EKG is ordered today.  The ekg ordered today demonstrates sinus rhythm with rightward axis rate 81 bpm, no acute ST/T wave changes.  Recent Labs: 04/15/2019: ALT 10; BUN 14; Creat 0.89; Hemoglobin 12.0; Platelets 285; Potassium 3.9; Sodium 135; TSH 2.44  Recent Lipid Panel No results found for: CHOL, TRIG, HDL, CHOLHDL, VLDL, LDLCALC, LDLDIRECT  Home Medications   Current Meds  Medication Sig  . cetirizine (ZYRTEC) 10 MG tablet Take 10 mg by mouth as needed for allergies.  . Multiple Vitamin (MULTIVITAMIN WITH MINERALS) TABS tablet Take 1 tablet by mouth daily.  . TRI-SPRINTEC 0.18/0.215/0.25 MG-35 MCG tablet Take 1 tablet by  mouth daily.    Review of Systems    Review of Systems  Constitution: Negative for chills, fever and malaise/fatigue.  Cardiovascular: Positive for near-syncope and palpitations. Negative for chest pain, dyspnea on exertion, leg swelling, orthopnea and syncope.  Respiratory: Negative for cough, shortness of breath and wheezing.   Gastrointestinal: Negative for nausea and vomiting.  Neurological: Positive for light-headedness. Negative for dizziness and weakness.    All other systems reviewed and are otherwise negative except as noted above.  Physical Exam    VS:  BP 113/69 (BP Location: Right Arm, Patient Position: Sitting, Cuff Size: Normal)   Pulse 81   Ht 5' 5.5" (1.664 m)   Wt 143 lb 12 oz (65.2 kg)   SpO2 99%   BMI 23.56 kg/m  , BMI Body mass index is 23.56 kg/m. GEN: Well nourished, well developed, in no acute distress. HEENT: normal. Neck: Supple, no JVD, carotid bruits, or masses. Cardiac: RRR, no murmurs, rubs, or gallops. No clubbing, cyanosis, edema.  Radials/DP/PT 2+ and equal bilaterally.  Respiratory:  Respirations regular and unlabored, clear to auscultation bilaterally. GI: Soft, nontender, nondistended, BS + x 4. MS: No deformity or atrophy. Skin: Warm and dry, no rash. Neuro:  Strength and sensation are intact. Psych: Normal affect.  Accessory Clinical Findings    ECG personally reviewed by me today - SR with rightward axis - no acute changes.  Assessment & Plan    1. Near-syncope/syncope - Last episode on Thanksgiving.  These events are described as being a "dreamlike "experience.  Tells me she is cognitive and feels like when she is almost passed out or started coming to in the past.  Previous syncopal episodes associated with illness, injury.  Reports her mother has a history of syncope and has worn a monitor in the past, unclear what her findings were.  Does endorse that she does not stay adequately hydrated throughout the day.  Orthostatic hypotension positive, as below.   We discussed a ZIO monitor today to rule out pauses or arrhythmia.  She is hesitant regarding cost and will call our office back if she decides to pursue monitor and we can mail to her.  We also discussed echocardiogram today to rule out structural abnormality, she politely declines.  2. Orthostatic hypotension - Noted orthostasis today. From laying to standing she went from laying 119/73 HR 90 to 103/69 HR 103 associated with lightheadedness.   Encouraged increased hydration, small regular meals.  Recommend use of compression stockings.  Encouraged slow positional changes.  3. Palpitations - Noted history for many years reports these are no different than usual.  She noticed them when lying down or resting.  Likely etiology PVC. EKG today NSR.  Recent labs by PCP include normal electrolytes, CBC, TSH.  We discussed ZIO monitor today, she politely declines today and tells me she will call our office if she decides to pursue.  Encourage reduce caffeine intake.  Disposition: Follow up in 8 week(s) with Dr. Rockey Situ or APP.    Loel Dubonnet, NP 04/27/2019, 12:49 PM

## 2019-04-27 NOTE — Patient Instructions (Addendum)
Medication Instructions:  No medication changes today.  *If you need a refill on your cardiac medications before your next appointment, please call your pharmacy*  Lab Work: None today  If you have labs (blood work) drawn today and your tests are completely normal, you will receive your results only by: Marland Kitchen MyChart Message (if you have MyChart) OR . A paper copy in the mail If you have any lab test that is abnormal or we need to change your treatment, we will call you to review the results.  Testing/Procedures: You had an EKG today.  Follow-Up: At [facility], you and your health needs are our priority.  As part of our continuing mission to provide you with exceptional heart care, we have created designated Provider Care Teams.  These Care Teams include your primary Cardiologist (physician) and Advanced Practice Providers (APPs -  Physician Assistants and Nurse Practitioners) who all work together to provide you with the care you need, when you need it.  Your next appointment:   8 week(s)  The format for your next appointment:   In Person  Provider:    You may see Dr. Rockey Situ or one of the following Advanced Practice Providers on your designated Care Team:    Murray Hodgkins, NP  Christell Annessa, PA-C  Marrianne Mood, PA-C  Other Instructions  If you decide to wear the ZIO monitor - please call our office and we will mail it to your home. You would wear for 2 weeks.   Be sure you are staying adequately hydrated and eating small, regular meals.    Orthostatic Hypotension Blood pressure is a measurement of how strongly, or weakly, your blood is pressing against the walls of your arteries. Orthostatic hypotension is a sudden drop in blood pressure that happens when you quickly change positions, such as when you get up from sitting or lying down. Arteries are blood vessels that carry blood from your heart throughout your body. When blood pressure is too low, you may not get  enough blood to your brain or to the rest of your organs. This can cause weakness, light-headedness, rapid heartbeat, and fainting. This can last for just a few seconds or for up to a few minutes. Orthostatic hypotension is usually not a serious problem. However, if it happens frequently or gets worse, it may be a sign of something more serious. What are the causes? This condition may be caused by:  Sudden changes in posture, such as standing up quickly after you have been sitting or lying down.  Blood loss.  Loss of body fluids (dehydration).  Heart problems.  Hormone (endocrine) problems.  Pregnancy.  Severe infection.  Lack of certain nutrients.  Severe allergic reactions (anaphylaxis).  Certain medicines, such as blood pressure medicine or medicines that make the body lose excess fluids (diuretics). Sometimes, this condition can be caused by not taking medicine as directed, such as taking too much of a certain medicine. What increases the risk? The following factors may make you more likely to develop this condition:  Age. Risk increases as you get older.  Conditions that affect the heart or the central nervous system.  Taking certain medicines, such as blood pressure medicine or diuretics.  Being pregnant. What are the signs or symptoms? Symptoms of this condition may include:  Weakness.  Light-headedness.  Dizziness.  Blurred vision.  Fatigue.  Rapid heartbeat.  Fainting, in severe cases. How is this diagnosed? This condition is diagnosed based on:  Your medical history.  Your symptoms.  Your blood pressure measurement. Your health care provider will check your blood pressure when you are: ? Lying down. ? Sitting. ? Standing. A blood pressure reading is recorded as two numbers, such as "120 over 80" (or 120/80). The first ("top") number is called the systolic pressure. It is a measure of the pressure in your arteries as your heart beats. The second  ("bottom") number is called the diastolic pressure. It is a measure of the pressure in your arteries when your heart relaxes between beats. Blood pressure is measured in a unit called mm Hg. Healthy blood pressure for most adults is 120/80. If your blood pressure is below 90/60, you may be diagnosed with hypotension. Other information or tests that may be used to diagnose orthostatic hypotension include:  Your other vital signs, such as your heart rate and temperature.  Blood tests.  Tilt table test. For this test, you will be safely secured to a table that moves you from a lying position to an upright position. Your heart rhythm and blood pressure will be monitored during the test. How is this treated? This condition may be treated by:  Changing your diet. This may involve eating more salt (sodium) or drinking more water.  Taking medicines to raise your blood pressure.  Changing the dosage of certain medicines you are taking that might be lowering your blood pressure.  Wearing compression stockings. These stockings help to prevent blood clots and reduce swelling in your legs. In some cases, you may need to go to the hospital for:  Fluid replacement. This means you will receive fluids through an IV.  Blood replacement. This means you will receive donated blood through an IV (transfusion).  Treating an infection or heart problems, if this applies.  Monitoring. You may need to be monitored while medicines that you are taking wear off. Follow these instructions at home: Eating and drinking   Drink enough fluid to keep your urine pale yellow.  Eat a healthy diet, and follow instructions from your health care provider about eating or drinking restrictions. A healthy diet includes: ? Fresh fruits and vegetables. ? Whole grains. ? Lean meats. ? Low-fat dairy products.  Eat extra salt only as directed. Do not add extra salt to your diet unless your health care provider told you to do  that.  Eat frequent, small meals.  Avoid standing up suddenly after eating. Medicines  Take over-the-counter and prescription medicines only as told by your health care provider. ? Follow instructions from your health care provider about changing the dosage of your current medicines, if this applies. ? Do not stop or adjust any of your medicines on your own. General instructions   Wear compression stockings as told by your health care provider.  Get up slowly from lying down or sitting positions. This gives your blood pressure a chance to adjust.  Avoid hot showers and excessive heat as directed by your health care provider.  Return to your normal activities as told by your health care provider. Ask your health care provider what activities are safe for you.  Do not use any products that contain nicotine or tobacco, such as cigarettes, e-cigarettes, and chewing tobacco. If you need help quitting, ask your health care provider.  Keep all follow-up visits as told by your health care provider. This is important. Contact a health care provider if you:  Vomit.  Have diarrhea.  Have a fever for more than 2-3 days.  Feel more thirsty than  usual.  Feel weak and tired. Get help right away if you:  Have chest pain.  Have a fast or irregular heartbeat.  Develop numbness in any part of your body.  Cannot move your arms or your legs.  Have trouble speaking.  Become sweaty or feel light-headed.  Faint.  Feel short of breath.  Have trouble staying awake.  Feel confused. Summary  Orthostatic hypotension is a sudden drop in blood pressure that happens when you quickly change positions.  Orthostatic hypotension is usually not a serious problem.  It is diagnosed by having your blood pressure taken lying down, sitting, and then standing.  It may be treated by changing your diet or adjusting your medicines. This information is not intended to replace advice given to you by  your health care provider. Make sure you discuss any questions you have with your health care provider. Document Released: 04/11/2002 Document Revised: 10/15/2017 Document Reviewed: 10/15/2017 Elsevier Patient Education  2020 [employer].

## 2019-06-20 ENCOUNTER — Ambulatory Visit: Payer: BC Managed Care – PPO | Admitting: Cardiovascular Disease

## 2019-08-24 DIAGNOSIS — C73 Malignant neoplasm of thyroid gland: Secondary | ICD-10-CM | POA: Insufficient documentation

## 2019-08-29 ENCOUNTER — Other Ambulatory Visit: Payer: Self-pay | Admitting: Surgery

## 2019-08-29 DIAGNOSIS — E041 Nontoxic single thyroid nodule: Secondary | ICD-10-CM

## 2019-08-29 DIAGNOSIS — C73 Malignant neoplasm of thyroid gland: Secondary | ICD-10-CM

## 2019-08-29 DIAGNOSIS — Z85850 Personal history of malignant neoplasm of thyroid: Secondary | ICD-10-CM

## 2019-12-20 IMAGING — US US THYROID
1 series · 14 of 25 positions shown · non-contrast
Comparison: 04/17/2017, 12/19/2015, 11/15/2014

CLINICAL DATA: 32-year-old female with a history of thyroid
carcinoma, status post partial thyroidectomy

EXAM:
THYROID ULTRASOUND
TECHNIQUE: Ultrasound examination of the thyroid gland and adjacent soft
tissues was performed.

[Series 1: us thyroid · 0.06mm/px · 35 acquisitions, 14 frames shown]
[im 1/35]
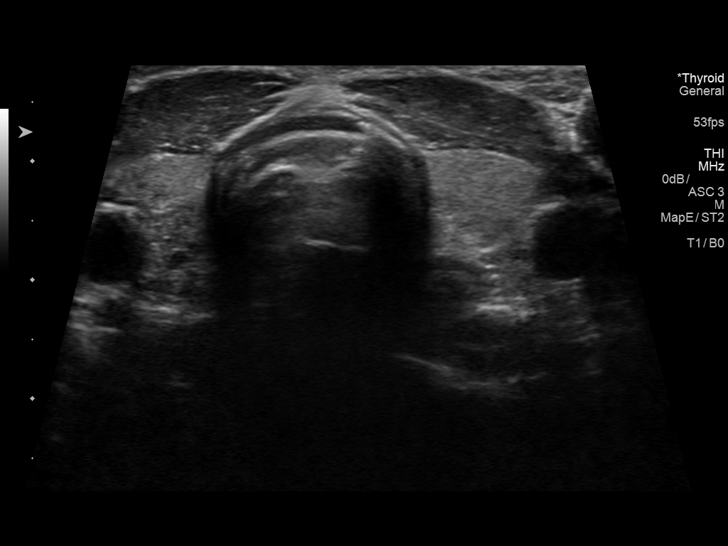
[im 3/35]
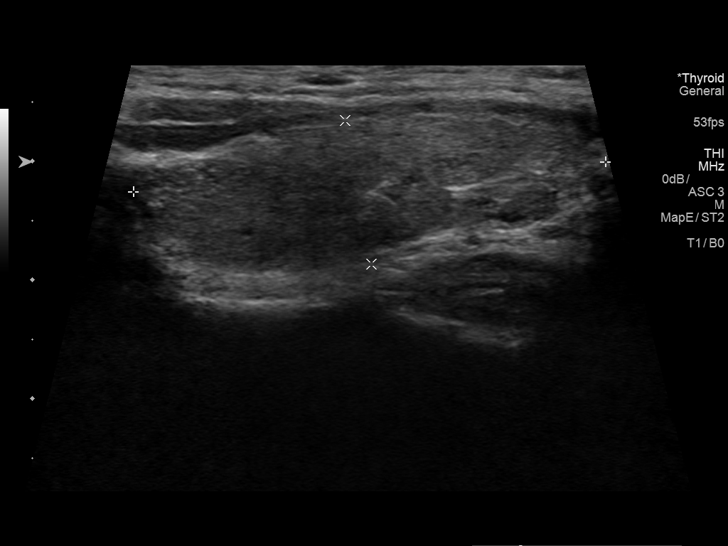
[im 6/35]
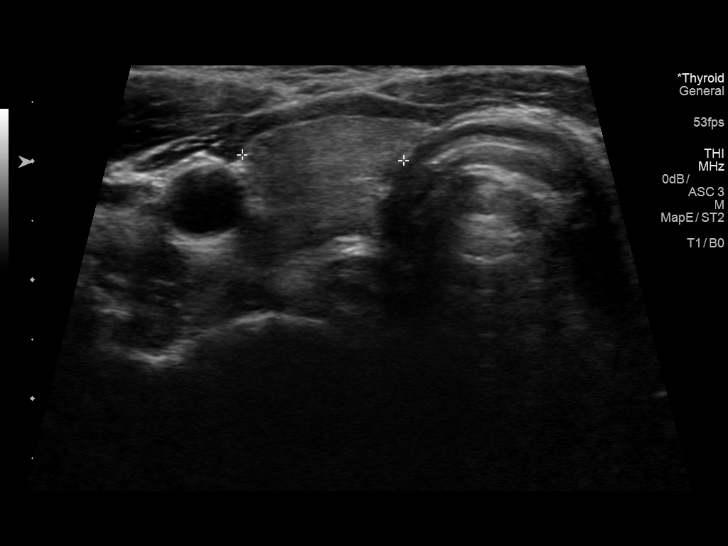
[im 9/35]
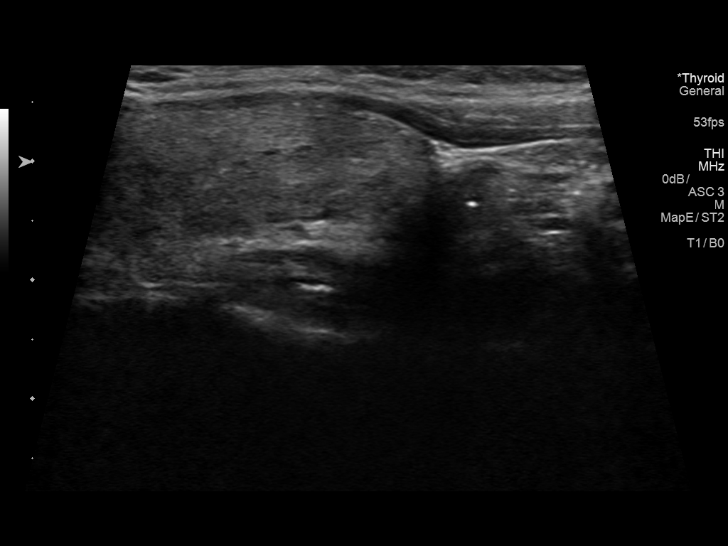
[im 12/35]
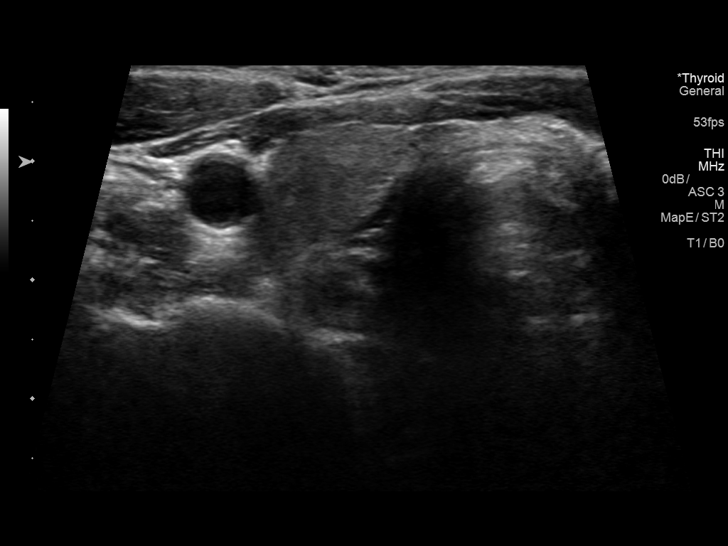
[im 13/35]
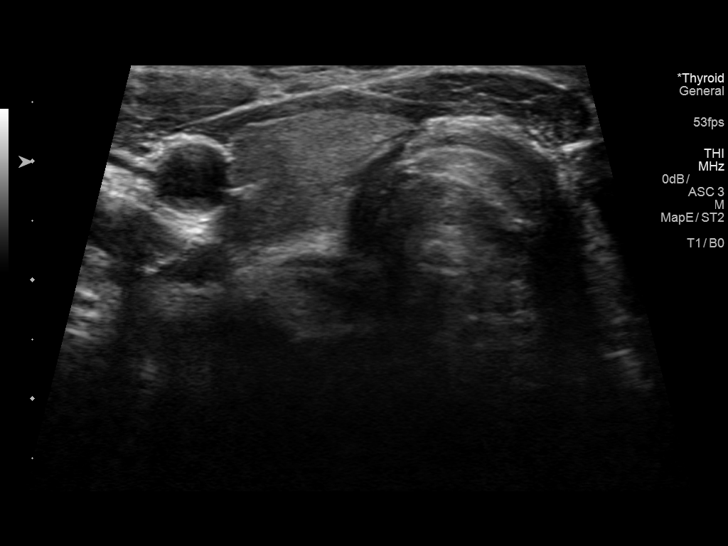
[im 16/35]
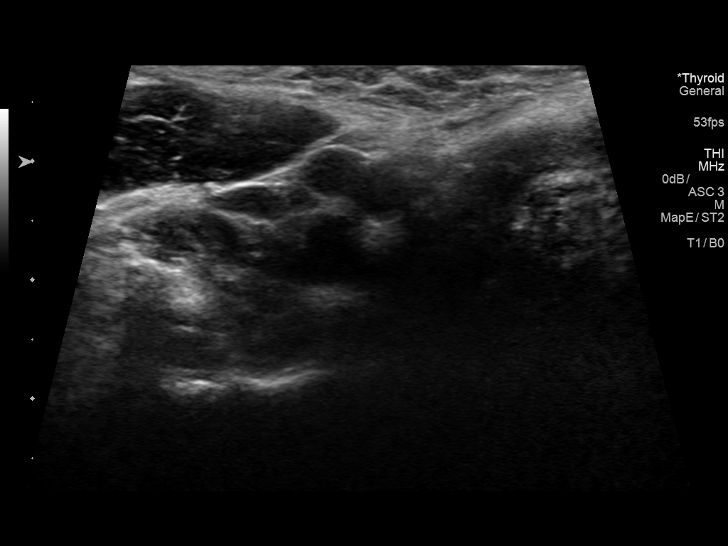
[im 19/35]
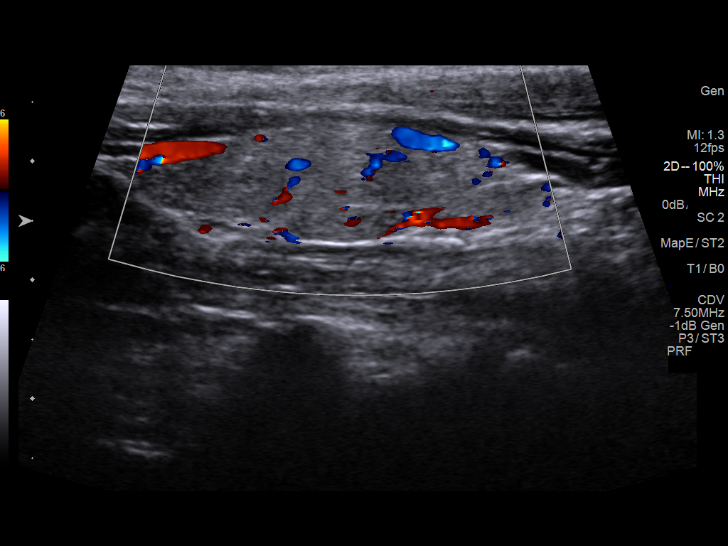
[im 22/35]
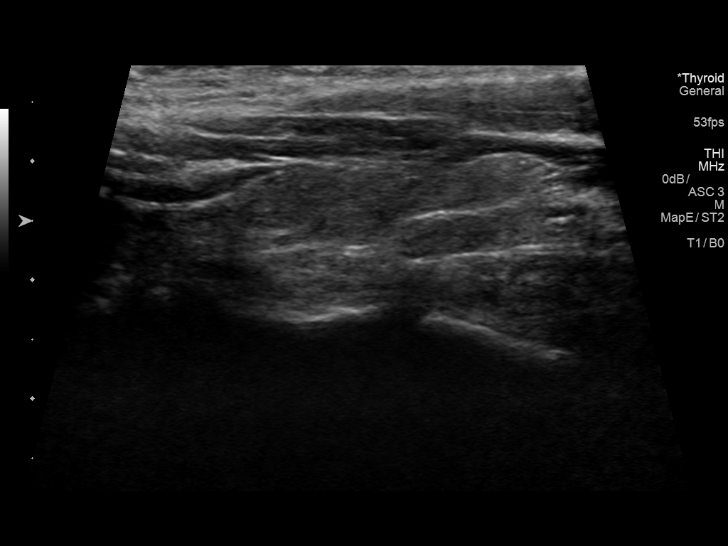
[im 23/35]
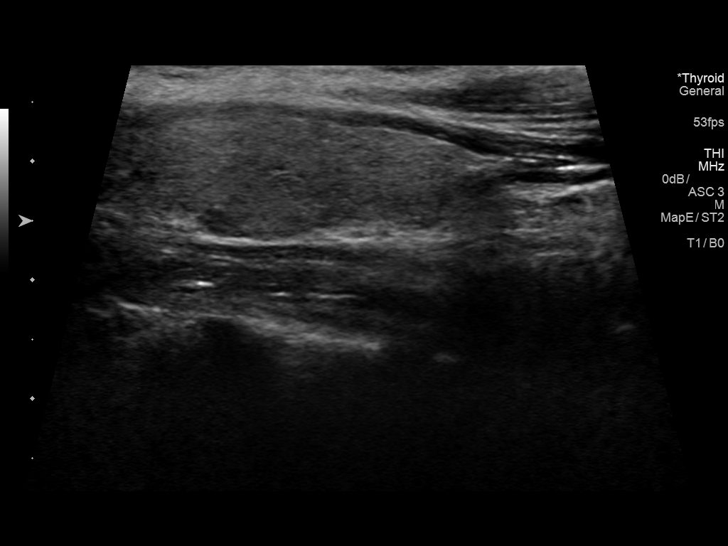
[im 26/35]
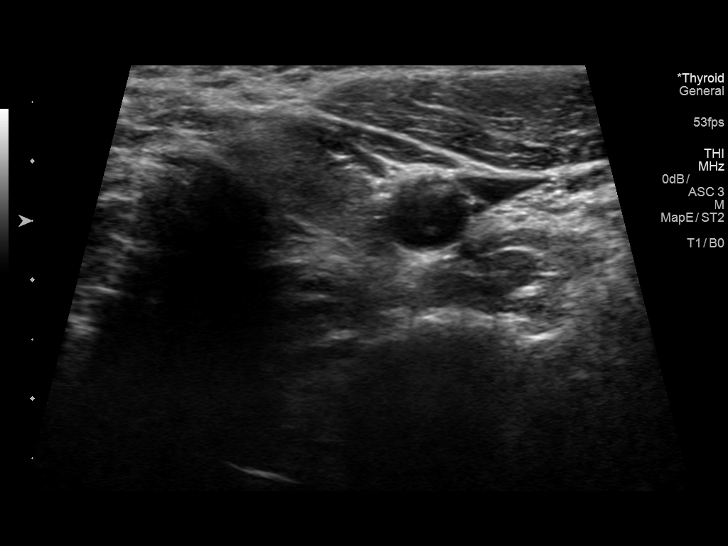
[im 29/35]
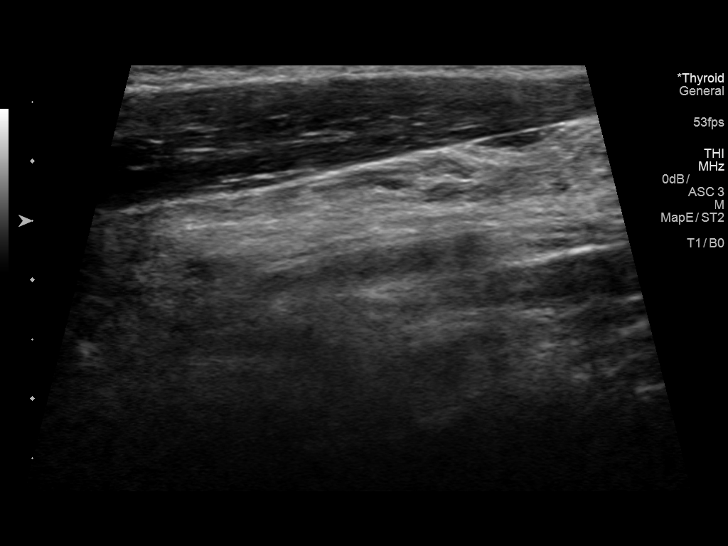
[im 32/35]
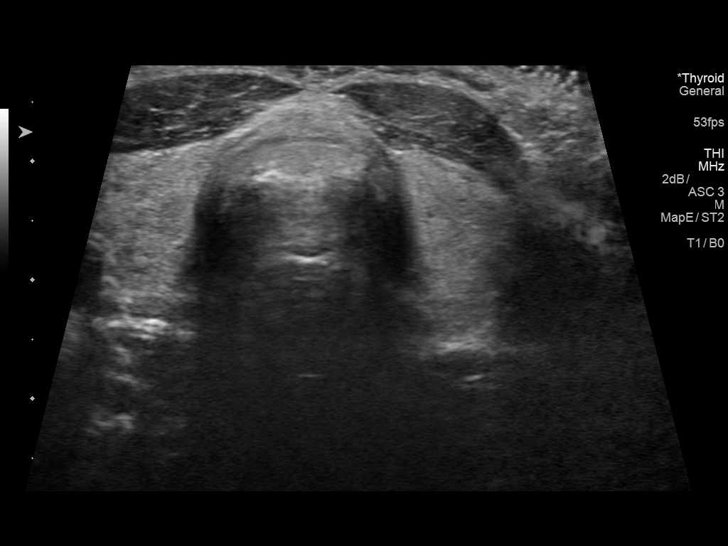
[im 35/35]
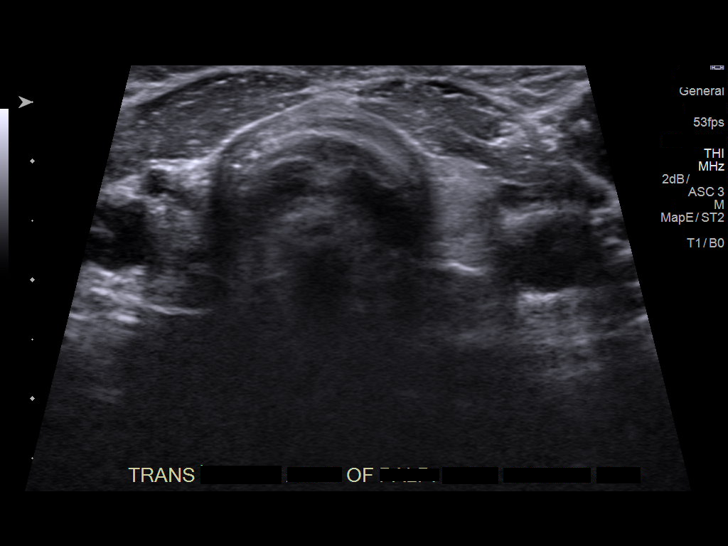

[14 of 25 positions shown; findings below may reference images not displayed]

FINDINGS: Parenchymal Echotexture: Normal

Isthmus: Surgically absent isthmus

Right lobe: 4.0 cm x 1.2 cm x 1.4 cm

Left lobe: 4.0 cm x 1.1 cm x 1.2 cm

_________________________________________________________

Estimated total number of nodules >/= 1 cm: 0

Number of spongiform nodules >/=  2 cm not described below (TR1): 0

Number of mixed cystic and solid nodules >/= 1.5 cm not described
below (TR2): 0

_________________________________________________________

No significant soft tissue at the surgical site.

No new nodules of the left or right thyroid.

No adenopathy or focal fluid.  No soft tissue lesion.
IMPRESSION: Surgical changes of partial thyroidectomy involving the isthmus.

No abnormal soft tissue, focal fluid, or new thyroid nodule.

## 2020-01-11 ENCOUNTER — Ambulatory Visit
Admission: RE | Admit: 2020-01-11 | Discharge: 2020-01-11 | Disposition: A | Discharge status: Home / Self Care | Payer: BC Managed Care – PPO | Source: Ambulatory Visit | Attending: Surgery | Admitting: Surgery

## 2020-01-11 DIAGNOSIS — Z85850 Personal history of malignant neoplasm of thyroid: Secondary | ICD-10-CM

## 2020-01-11 DIAGNOSIS — C73 Malignant neoplasm of thyroid gland: Secondary | ICD-10-CM

## 2020-01-11 DIAGNOSIS — E041 Nontoxic single thyroid nodule: Secondary | ICD-10-CM

## 2020-04-18 ENCOUNTER — Ambulatory Visit: Payer: BC Managed Care – PPO | Admitting: Family

## 2020-04-18 ENCOUNTER — Encounter: Payer: Self-pay | Admitting: Family

## 2020-04-18 ENCOUNTER — Other Ambulatory Visit: Payer: Self-pay

## 2020-04-18 ENCOUNTER — Ambulatory Visit (INDEPENDENT_AMBULATORY_CARE_PROVIDER_SITE_OTHER): Payer: BC Managed Care – PPO

## 2020-04-18 VITALS — BP 114/60 | HR 64 | Ht 65.0 in | Wt 145.0 lb

## 2020-04-18 DIAGNOSIS — I951 Orthostatic hypotension: Secondary | ICD-10-CM | POA: Diagnosis not present

## 2020-04-18 DIAGNOSIS — R55 Syncope and collapse: Secondary | ICD-10-CM | POA: Diagnosis not present

## 2020-04-18 DIAGNOSIS — R002 Palpitations: Secondary | ICD-10-CM | POA: Diagnosis not present

## 2020-04-18 NOTE — Progress Notes (Signed)
Office Visit    Patient Name: Crystal Humphrey Date of Encounter: 04/18/2020  Primary Care Provider:  Burnard Hawthorne, FNP Primary Cardiologist:  Ida Rogue, MD Electrophysiologist:  None   Chief Complaint    Crystal Humphrey is a 34 y.o. female with a hx of syncope, palpitations, thyroid cancer s/p thyroidectomy presents today for syncope  Past Medical History    Past Medical History:  Diagnosis Date  . Allergy   . Cancer ([place])   . Headache   . Heart palpitations   . PONV (postoperative nausea and vomiting)   . Thyroid nodule    Past Surgical History:  Procedure Laterality Date  . biopsy on thyroid    . SKIN CYST REMOVED  AGE 54  . THYROIDECTOMY N/A 03/07/2016   Procedure: THYROID ISTHMUSECTOMY;  Surgeon: Armandina Gemma, MD;  Location: [facility];  Service: General;  Laterality: N/A;  . WISDOM TOOTH EXTRACTION  AGE 54   Allergies  No Known Allergies  History of Present Illness    Crystal Humphrey is a 34 y.o. female with a hx of syncope, palpitations, thyroid cancer s/p thyroidectomy last seen by Dr. Kennyth Arnold on 02/17/2017.  She presents today for evaluation of syncope at request of her PCP.  At previous office visit her near syncope and syncopal symptoms were noted to be suggestive of vasovagal syncope.  She was offered a monitor but politely declined as symptoms had improved  Labs 04/15/2019 normal electrolytes, normal kidney/liver function, hemoglobin 12, normal thyroid function.  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 She has 79 and 55-year-old children at home and just found out she is pregnant.   Notes her symptoms of palpitations were more notable with previous pregnancies. Also has history of fainting while pregnant, ill, or injured. She reports over the last 1-2 months she has had increasing palpitations. Not associated with chest pain or shortness of breath but feels like a "squeezing". No near syncope nor syncope. Palpitations are most  notable when laying on her left side.   Reports some recently increased stress. Drinks at most 2 four ounce caffeinated beverages per day. No prescribed nor over the counter proarrhythmic agents.    EKGs/Labs/Other Studies Reviewed:   The following studies were reviewed today:  EKG:  EKG is ordered today.  The ekg ordered today demonstrates SR 64 bpm with no acute ST/T wave changes.   Recent Labs: No results found for requested labs within last 8760 hours.  Recent Lipid Panel No results found for: CHOL, TRIG, HDL, CHOLHDL, VLDL, LDLCALC, LDLDIRECT  Home Medications   Current Meds  Medication Sig  . cetirizine (ZYRTEC) 10 MG tablet Take 10 mg by mouth as needed for allergies.  . Multiple Vitamin (MULTIVITAMIN WITH MINERALS) TABS tablet Take 1 tablet by mouth daily.    Review of Systems     Review of Systems  Constitutional: Negative for chills, fever and malaise/fatigue.  Cardiovascular: Positive for palpitations. Negative for chest pain, dyspnea on exertion, leg swelling, near-syncope, orthopnea and syncope.  Respiratory: Negative for cough, shortness of breath and wheezing.   Gastrointestinal: Negative for nausea and vomiting.  Neurological: Negative for dizziness, light-headedness and weakness.   All other systems reviewed and are otherwise negative except as noted above.  Physical Exam    VS:  BP 114/60 (BP Location: Left Arm, Patient Position: Sitting, Cuff Size: Normal)   Pulse 64   Ht 5\' 5"  (1.651 m)   Wt 145 lb (65.8 kg)  SpO2 99%   BMI 24.13 kg/m  , BMI Body mass index is 24.13 kg/m. GEN: Well nourished, well developed, in no acute distress. HEENT: normal. Neck: Supple, no JVD, carotid bruits, or masses. Cardiac: RRR, no murmurs, rubs, or gallops. No clubbing, cyanosis, edema.  Radials/DP/PT 2+ and equal bilaterally.  Respiratory:  Respirations regular and unlabored, clear to auscultation bilaterally. GI: Soft, nontender, nondistended, BS + x 4. MS: No  deformity or atrophy. Skin: Warm and dry, no rash. Neuro:  Strength and sensation are intact. Psych: Normal affect. Assessment & Plan    1. Near-syncope/syncope/orthostatic hypotension/palpitations - History of near syncope likely vagal episodes. Worsening palpitations most notable at night over last 1-2 months. 14 day ZIO monitor placed in clinic. If monitor shows significant arrhythmia, plan for echocardiogram. She reports recent lab work with PCP/OBGYN showed normal electrolytes and thyroid function. Discussed avoiding caffeine, staying well hydrated, and other preventative measures. Suppression of arrhythmia with medications may be difficult as she recently found out she is pregnant.   Disposition: Follow up in 8 week(s) with Dr. Rockey Situ or APP.    Loel Dubonnet, NP 04/18/2020, 3:49 PM

## 2020-04-18 NOTE — Patient Instructions (Addendum)
Medication Instructions:  No medication changes today.   *If you need a refill on your cardiac medications before your next appointment, please call your pharmacy*  Lab Work: None ordered today.   Testing/Procedures: Your physician has recommended that you wear a Zio monitor. This monitor is a medical device that records the heart's electrical activity. Doctors most often use these monitors to diagnose arrhythmias. Arrhythmias are problems with the speed or rhythm of the heartbeat. The monitor is a small device applied to your chest. You can wear one while you do your normal daily activities. While wearing this monitor if you have any symptoms to push the button and record what you felt. Once you have worn this monitor for the period of time provider prescribed (14 days), you will return the monitor device in the postage paid box. Once it is returned they will download the data collected and provide [place] with a report which the provider will then review and we will call you with those results. Important tips:  1. Avoid showering during the first 24 hours of wearing the monitor. 2. Avoid excessive sweating to help maximize wear time. 3. Do not submerge the device, no hot tubs, and no swimming pools. 4. Keep any lotions or oils away from the patch. 5. After 24 hours you may shower with the patch on. Take brief showers with your back facing the shower head.  6. Do not remove patch once it has been placed because that will interrupt data and decrease adhesive wear time. 7. Push the button when you have any symptoms and write down what you were feeling. 8. Once you have completed wearing your monitor, remove and place into box which has postage paid and place in your outgoing mailbox.  9. If for some reason you have misplaced your box then call our office and we can provide another box and/or mail it off for you.     Follow-Up: At [facility], you and your health needs are our priority.  As part  of our continuing mission to provide you with exceptional heart care, we have created designated Provider Care Teams.  These Care Teams include your primary Cardiologist (physician) and Advanced Practice Providers (APPs -  Physician Assistants and Nurse Practitioners) who all work together to provide you with the care you need, when you need it.  We recommend signing up for the patient portal called "MyChart".  Sign up information is provided on this After Visit Summary.  MyChart is used to connect with patients for Virtual Visits (Telemedicine).  Patients are able to view lab/test results, encounter notes, upcoming appointments, etc.  Non-urgent messages can be sent to your provider as well.   To learn more about what you can do with MyChart, go to [email or web address].    Your next appointment:   2 month(s)  The format for your next appointment:   In Person or Virtual  Provider:   You may see Ida Rogue, MD or one of the following Advanced Practice Providers on your designated Care Team:    Murray Hodgkins, NP  Christell Doreatha, PA-C  Marrianne Mood, PA-C  Cadence Kathlen Mody, [place]  Laurann Montana, NP  Other Instructions  Premature Ventricular Contraction  A premature ventricular contraction (PVC) is a common kind of irregular heartbeat (arrhythmia). These contractions are extra heartbeats that start in the ventricles of the heart and occur too early in the normal sequence. During the PVC, the heart's normal electrical pathway is not used, so the beat is  shorter and less effective. In most cases, these contractions come and go and do not require treatment. What are the causes? Common causes of the condition include:  Smoking.  Drinking alcohol.  Certain medicines.  Some illegal drugs.  Stress.  Caffeine. Certain medical conditions can also cause PVCs:  Heart failure.  Heart attack, or coronary artery disease.  Heart valve problems.  Changes in minerals in the blood  (electrolytes).  Low blood oxygen levels or high carbon dioxide levels. In many cases, the cause of this condition is not known. What are the signs or symptoms? The main symptom of this condition is fast or skipped heartbeats (palpitations). Other symptoms include:  Chest pain.  Shortness of breath.  Feeling tired.  Dizziness.  Difficulty exercising. In some cases, there are no symptoms. How is this diagnosed? This condition may be diagnosed based on:  Your medical history.  A physical exam. During the exam, the health care provider will check for irregular heartbeats.  Tests, such as: ? An ECG (electrocardiogram) to monitor the electrical activity of your heart. ? An ambulatory cardiac monitor. This device records your heartbeats for 24 hours or more. ? Stress tests to see how exercise affects your heart rhythm and blood supply. ? An echocardiogram. This test uses sound waves (ultrasound) to produce an image of your heart. ? An electrophysiology study (EPS). This test checks for electrical problems in your heart. How is this treated? Treatment for this condition depends on any underlying conditions, the type of PVCs that you are having, and how much the symptoms are interfering with your daily life. Possible treatments include:  Avoiding things that cause premature contractions (triggers). These include caffeine and alcohol.  Taking medicines if symptoms are severe or if the extra heartbeats are frequent.  Getting treatment for underlying conditions that cause PVCs.  Having an implantable cardioverter defibrillator (ICD), if you are at risk for a serious arrhythmia. The ICD is a small device that is inserted into your chest to monitor your heartbeat. When it senses an irregular heartbeat, it sends a shock to bring the heartbeat back to normal.  Having a procedure to destroy the portion of the heart tissue that sends out abnormal signals (catheter ablation). In some cases,  no treatment is required. Follow these instructions at home: Lifestyle  Do not use any products that contain nicotine or tobacco, such as cigarettes, e-cigarettes, and chewing tobacco. If you need help quitting, ask your health care provider.  Do not use illegal drugs.  Exercise regularly. Ask your health care provider what type of exercise is safe for you.  Try to get at least 7-9 hours of sleep each night, or as much as recommended by your health care provider.  Find healthy ways to manage stress. Avoid stressful situations when possible. Alcohol use  Do not drink alcohol if: ? Your health care provider tells you not to drink. ? You are pregnant, may be pregnant, or are planning to become pregnant. ? Alcohol triggers your episodes.  If you drink alcohol: ? Limit how much you use to:  0-1 drink a day for women.  0-2 drinks a day for men.  Be aware of how much alcohol is in your drink. In the [place] one drink equals one 12 oz bottle of beer (355 mL), one 5 oz glass of wine (148 mL), or one 1 oz glass of hard liquor (44 mL). General instructions  Take over-the-counter and prescription medicines only as told by your health care  provider.  If caffeine triggers episodes of PVC, do not eat, drink, or use anything with caffeine in it.  Keep all follow-up visits as told by your health care provider. This is important. Contact a health care provider if you:  Feel palpitations. Get help right away if you:  Have chest pain.  Have shortness of breath.  Have sweating for no reason.  Have nausea and vomiting.  Become light-headed or you faint. Summary  A premature ventricular contraction (PVC) is a common kind of irregular heartbeat (arrhythmia).  In most cases, these contractions come and go and do not require treatment.  You may need to wear an ambulatory cardiac monitor. This records your heartbeats for 24 hours or more.  Treatment depends on any underlying conditions,  the type of PVCs that you are having, and how much the symptoms are interfering with your daily life. This information is not intended to replace advice given to you by your health care provider. Make sure you discuss any questions you have with your health care provider. Document Revised: 01/14/2018 Document Reviewed: 01/14/2018 Elsevier Patient Education  2020 [employer].

## 2020-05-25 LAB — OB RESULTS CONSOLE GC/CHLAMYDIA
Chlamydia: NEGATIVE
Gonorrhea: NEGATIVE

## 2020-05-25 LAB — OB RESULTS CONSOLE ANTIBODY SCREEN: Antibody Screen: NEGATIVE

## 2020-05-25 LAB — OB RESULTS CONSOLE RUBELLA ANTIBODY, IGM: Rubella: IMMUNE

## 2020-05-25 LAB — OB RESULTS CONSOLE HIV ANTIBODY (ROUTINE TESTING): HIV: NONREACTIVE

## 2020-05-25 LAB — OB RESULTS CONSOLE RPR: RPR: NONREACTIVE

## 2020-05-25 LAB — OB RESULTS CONSOLE ABO/RH: RH Type: POSITIVE

## 2020-05-25 LAB — OB RESULTS CONSOLE HEPATITIS B SURFACE ANTIGEN: Hepatitis B Surface Ag: NEGATIVE

## 2020-06-19 ENCOUNTER — Encounter: Payer: Self-pay | Admitting: Family

## 2020-06-19 ENCOUNTER — Other Ambulatory Visit: Payer: Self-pay

## 2020-06-19 ENCOUNTER — Ambulatory Visit: Payer: BC Managed Care – PPO | Admitting: Family

## 2020-06-19 VITALS — BP 102/60 | HR 64 | Ht 65.0 in | Wt 145.0 lb

## 2020-06-19 DIAGNOSIS — I951 Orthostatic hypotension: Secondary | ICD-10-CM

## 2020-06-19 DIAGNOSIS — R002 Palpitations: Secondary | ICD-10-CM | POA: Diagnosis not present

## 2020-06-19 NOTE — Progress Notes (Signed)
Office Visit    Patient Name: Crystal Humphrey Date of Encounter: 06/19/2020  Primary Care Provider:  Burnard Hawthorne, FNP Primary Cardiologist:  Ida Rogue, MD Electrophysiologist:  None   Chief Complaint    Crystal Humphrey is a 35 y.o. female with a hx of syncope, palpitations, thyroid cancer s/p thyroidectomy presents today for follow-up after ZIO monitor  Past Medical History    Past Medical History:  Diagnosis Date  . Allergy   . Cancer ([place])   . Headache   . Heart palpitations   . PONV (postoperative nausea and vomiting)   . Thyroid nodule    Past Surgical History:  Procedure Laterality Date  . biopsy on thyroid    . SKIN CYST REMOVED  AGE 3  . THYROIDECTOMY N/A 03/07/2016   Procedure: THYROID ISTHMUSECTOMY;  Surgeon: Armandina Gemma, MD;  Location: [facility];  Service: General;  Laterality: N/A;  . WISDOM TOOTH EXTRACTION  AGE 3   Allergies  No Known Allergies  History of Present Illness    Crystal Humphrey is a 35 y.o. female with a hx of syncope, palpitations, thyroid cancer s/p thyroidectomy last seen 04/18/2020.  At previous office visit with Dr. Rockey Situ in 2018 her near syncope and syncopal symptoms were noted to be suggestive of vasovagal syncope.  She was offered a monitor but politely declined as symptoms had improved  Labs 04/15/2019 normal electrolytes, normal kidney/liver function, hemoglobin 12, normal thyroid function.  Seen in follow-up April 18, 2020 noting increased palpitations.  She works as an Educational psychologist. She has 70 and 60-year-old children at home and when last seen had just found out she was pregnant.  She was recommended for 14-day ZIO monitor.  Subsequent 14-day event monitor showed predominantly normal sinus rhythm with average heart rate 76 bpm, 3 short runs of SVT/atrial tachycardia fastest 5 beats max of 132 and longest 6 beats at 103 bpm which were not triggered events.  She had rare PVC's less than 1% and her  triggers were associated with PVCs.  Presents today for follow-up.  We reviewed the monitor in detail.  She tells me she has decreased her caffeinated tea intake and notices reduced palpitations.  Reports occasional palpitations predominantly noticeable when laying down at night.  We discussed that this is very common.  She does lay on her right side and this improves palpitations.  Reports no chest pain, pressure, or tightness. No edema, orthopnea, PND. Endorses occasional shortness of breath when she has to sing for work with a mask on though this is unchanged from prior. She is [redacted] weeks along in her pregnancy and reports her nausea has mostly resolved.   EKGs/Labs/Other Studies Reviewed:   The following studies were reviewed today:  EKG:  No EKG is ordered today.  The ekg independently reviewed from 04/18/2020 demonstrated SR 64 bpm with no acute ST/T wave changes.   Recent Labs: No results found for requested labs within last 8760 hours.  Recent Lipid Panel No results found for: CHOL, TRIG, HDL, CHOLHDL, VLDL, LDLCALC, LDLDIRECT  Home Medications   Current Meds  Medication Sig  . cetirizine (ZYRTEC) 10 MG tablet Take 10 mg by mouth as needed for allergies.  . Multiple Vitamin (MULTIVITAMIN WITH MINERALS) TABS tablet Take 1 tablet by mouth daily.    Review of Systems     Review of Systems  Constitutional: Negative for chills, fever and malaise/fatigue.  Cardiovascular: Positive for palpitations. Negative for chest pain, dyspnea on exertion, leg  swelling, near-syncope, orthopnea and syncope.  Respiratory: Negative for cough, shortness of breath and wheezing.   Gastrointestinal: Negative for nausea and vomiting.  Neurological: Negative for dizziness, light-headedness and weakness.   All other systems reviewed and are otherwise negative except as noted above.  Physical Exam    VS:  BP 102/60 (BP Location: Left Arm, Patient Position: Sitting, Cuff Size: Normal)   Pulse 64   Ht  5\' 5"  (1.651 m)   Wt 145 lb (65.8 kg)   SpO2 99%   BMI 24.13 kg/m  , BMI Body mass index is 24.13 kg/m. GEN: Well nourished, well developed, in no acute distress. HEENT: normal. Neck: Supple, no JVD, carotid bruits, or masses. Cardiac: RRR, no murmurs, rubs, or gallops. No clubbing, cyanosis, edema.  Radials/DP/PT 2+ and equal bilaterally.  Respiratory:  Respirations regular and unlabored, clear to auscultation bilaterally. GI: Soft, nontender, nondistended, BS + x 4. MS: No deformity or atrophy. Skin: Warm and dry, no rash. Neuro:  Strength and sensation are intact. Psych: Normal affect. Assessment & Plan    1. Near-syncope/syncope/orthostatic hypotension/palpitations - History of near syncope likely vagal episodes.  No recurrence.  At last clinic visit noted occasional palpitations.  Subsequent ZIO monitor 05/17/2020 with predominantly NSR, 3 runs of SVT/atrial tachycardia which were asymptomatic, isolated PVCs were rare less than 1% burden though patient triggered episodes were associated PVC.  We discussed that beta-blocker therapy is not recommended in therapy and recommend conservative management.  Notes improvement in palpitations since reducing caffeine intake.  Encouraged to stay well-hydrated.  No indication for echocardiogram at this time.    Disposition: Follow up prn with Dr. Rockey Situ or APP.    Loel Dubonnet, NP 06/19/2020, 4:19 PM

## 2020-06-19 NOTE — Patient Instructions (Signed)
Medication Instructions:  Continue your current medications.   *If you need a refill on your cardiac medications before your next appointment, please call your pharmacy*   Lab Work: No lab work today.   Testing/Procedures: Your ZIO monitor showed rare PVC's or premature ventricular contractions which occurred less than 1% of the time.   Follow-Up: At [facility], you and your health needs are our priority.  As part of our continuing mission to provide you with exceptional heart care, we have created designated Provider Care Teams.  These Care Teams include your primary Cardiologist (physician) and Advanced Practice Providers (APPs -  Physician Assistants and Nurse Practitioners) who all work together to provide you with the care you need, when you need it.  We recommend signing up for the patient portal called "MyChart".  Sign up information is provided on this After Visit Summary.  MyChart is used to connect with patients for Virtual Visits (Telemedicine).  Patients are able to view lab/test results, encounter notes, upcoming appointments, etc.  Non-urgent messages can be sent to your provider as well.   To learn more about what you can do with MyChart, go to [email or web address].    Your next appointment:   As needed   The format for your next appointment:   In Person  Provider:   You may see Ida Rogue, MD or one of the following Advanced Practice Providers on your designated Care Team:    Murray Hodgkins, NP  Christell Zurisadai, PA-C  Marrianne Mood, PA-C  Cadence Kathlen Mody, [place]  Laurann Montana, NP  Other Instructions   Palpitations Palpitations are feelings that your heartbeat is not normal. Your heartbeat may feel like it is:  Uneven.  Faster than normal.  Fluttering.  Skipping a beat. This is usually not a serious problem. In some cases, you may need tests to rule out any serious problems. Follow these instructions at home: Pay attention to any changes in  your condition. Take these actions to help manage your symptoms: Eating and drinking  Avoid: ? Coffee, tea, soft drinks, and energy drinks. ? Chocolate. ? Alcohol. ? Diet pills. Lifestyle  Try to lower your stress. These things can help you relax: ? Yoga. ? Deep breathing and meditation. ? Exercise. ? Using words and images to create positive thoughts (guided imagery). ? Using your mind to control things in your body (biofeedback).  Do not use drugs.  Get plenty of rest and sleep. Keep a regular bed time.   General instructions  Take over-the-counter and prescription medicines only as told by your doctor.  Do not use any products that contain nicotine or tobacco, such as cigarettes and e-cigarettes. If you need help quitting, ask your doctor.  Keep all follow-up visits as told by your doctor. This is important. You may need more tests if palpitations do not go away or get worse.   Contact a doctor if:  Your symptoms last more than 24 hours.  Your symptoms occur more often. Get help right away if you:  Have chest pain.  Feel short of breath.  Have a very bad headache.  Feel dizzy.  Pass out (faint). Summary  Palpitations are feelings that your heartbeat is uneven or faster than normal. It may feel like your heart is fluttering or skipping a beat.  Avoid food and drinks that may cause palpitations. These include caffeine, chocolate, and alcohol.  Try to lower your stress. Do not smoke or use drugs.  Get help right away if you faint  or have chest pain, shortness of breath, a severe headache, or dizziness. This information is not intended to replace advice given to you by your health care provider. Make sure you discuss any questions you have with your health care provider. Document Revised: 06/03/2017 Document Reviewed: 06/03/2017 Elsevier Patient Education  2021 [employer].

## 2020-11-27 LAB — OB RESULTS CONSOLE GBS: GBS: NEGATIVE

## 2020-12-18 ENCOUNTER — Telehealth (HOSPITAL_COMMUNITY): Payer: Self-pay | Admitting: *Deleted

## 2020-12-18 ENCOUNTER — Encounter (HOSPITAL_COMMUNITY): Payer: Self-pay | Admitting: *Deleted

## 2020-12-18 NOTE — Telephone Encounter (Signed)
Preadmission screen  

## 2020-12-19 ENCOUNTER — Telehealth (HOSPITAL_COMMUNITY): Payer: Self-pay | Admitting: *Deleted

## 2020-12-19 NOTE — Telephone Encounter (Signed)
Preadmission screen  

## 2020-12-20 ENCOUNTER — Encounter (HOSPITAL_COMMUNITY): Payer: Self-pay | Admitting: *Deleted

## 2020-12-20 ENCOUNTER — Telehealth (HOSPITAL_COMMUNITY): Payer: Self-pay | Admitting: *Deleted

## 2020-12-20 ENCOUNTER — Encounter (HOSPITAL_COMMUNITY): Payer: Self-pay

## 2020-12-20 NOTE — Telephone Encounter (Signed)
Preadmission screen  

## 2020-12-21 NOTE — H&P (Signed)
Crystal Humphrey is a 35 y.o. female presenting for IOL at term. Pregnancy complicated by tachycardia with benign cardiology evaluation, Hx of thyroid cancer with isthmusectomy in '17 - no meds. OB History     Gravida  3   Para  2   Term  2   Preterm      AB      Living  2      SAB      IAB      Ectopic      Multiple  0   Live Births  2          Past Medical History:  Diagnosis Date   Allergy    Cancer ([place])    thyroid   Headache    Heart palpitations    PONV (postoperative nausea and vomiting)    Thyroid nodule    Past Surgical History:  Procedure Laterality Date   biopsy on thyroid     SKIN CYST REMOVED  AGE 38   THYROIDECTOMY N/A 03/07/2016   Procedure: THYROID ISTHMUSECTOMY;  Surgeon: Armandina Gemma, MD;  Location: [facility];  Service: General;  Laterality: N/A;   WISDOM TOOTH EXTRACTION  AGE 4   Family History: family history includes Arthritis in her maternal grandfather; Cancer in her maternal grandmother; Diabetes in her paternal grandfather; Hyperlipidemia in her father; Liver disease in her paternal grandfather. Social History:  reports that she has never smoked. She has never used smokeless tobacco. She reports that she does not drink alcohol and does not use drugs.     Maternal Diabetes: No Genetic Screening: Normal Maternal Ultrasounds/Referrals: Normal Fetal Ultrasounds or other Referrals:  None Maternal Substance Abuse:  No Significant Maternal Medications:  None Significant Maternal Lab Results:  Group B Strep negative Other Comments:  None  Review of Systems History   currently breastfeeding. Exam Physical Exam  Prenatal labs: ABO, Rh: O/Positive/-- (01/21 0000) Antibody: Negative (01/21 0000) Rubella: Immune (01/21 0000) RPR: Nonreactive (01/21 0000)  HBsAg: Negative (01/21 0000)  HIV: Non-reactive (01/21 0000)  GBS:   negative  11/27/20  Assessment/Plan: 35 yo G3P2 @ 40 2/7 wks for IOL   Crystal Humphrey 12/21/2020, 12:13  PM

## 2020-12-21 NOTE — H&P (Deleted)
  The note originally documented on this encounter has been moved the the encounter in which it belongs.  

## 2020-12-25 ENCOUNTER — Inpatient Hospital Stay (HOSPITAL_COMMUNITY): Payer: BC Managed Care – PPO

## 2020-12-25 ENCOUNTER — Inpatient Hospital Stay (HOSPITAL_COMMUNITY): Admission: AD | Admit: 2020-12-25 | Payer: BC Managed Care – PPO | Source: Home / Self Care | Admitting: Family Medicine

## 2020-12-25 ENCOUNTER — Inpatient Hospital Stay (HOSPITAL_COMMUNITY)
Admission: AD | Admit: 2020-12-25 | Discharge: 2020-12-27 | DRG: 807 | Disposition: A | Discharge status: Home / Self Care | Payer: BC Managed Care – PPO | Attending: Obstetrics and Gynecology | Admitting: Obstetrics and Gynecology

## 2020-12-25 ENCOUNTER — Encounter (HOSPITAL_COMMUNITY): Payer: Self-pay | Admitting: Obstetrics and Gynecology

## 2020-12-25 ENCOUNTER — Other Ambulatory Visit: Payer: Self-pay

## 2020-12-25 DIAGNOSIS — Z20822 Contact with and (suspected) exposure to covid-19: Secondary | ICD-10-CM | POA: Diagnosis present

## 2020-12-25 DIAGNOSIS — Z3490 Encounter for supervision of normal pregnancy, unspecified, unspecified trimester: Secondary | ICD-10-CM

## 2020-12-25 DIAGNOSIS — O99892 Other specified diseases and conditions complicating childbirth: Principal | ICD-10-CM | POA: Diagnosis present

## 2020-12-25 DIAGNOSIS — R000 Tachycardia, unspecified: Secondary | ICD-10-CM | POA: Diagnosis present

## 2020-12-25 DIAGNOSIS — Z3A40 40 weeks gestation of pregnancy: Secondary | ICD-10-CM

## 2020-12-25 DIAGNOSIS — O26893 Other specified pregnancy related conditions, third trimester: Secondary | ICD-10-CM | POA: Diagnosis present

## 2020-12-25 LAB — TYPE AND SCREEN
ABO/RH(D): O POS
Antibody Screen: NEGATIVE

## 2020-12-25 LAB — CBC
HCT: 34.8 % — ABNORMAL LOW (ref 36.0–46.0)
Hemoglobin: 11.6 g/dL — ABNORMAL LOW (ref 12.0–15.0)
MCH: 31.2 pg (ref 26.0–34.0)
MCHC: 33.3 g/dL (ref 30.0–36.0)
MCV: 93.5 fL (ref 80.0–100.0)
Platelets: 257 10*3/uL (ref 150–400)
RBC: 3.72 MIL/uL — ABNORMAL LOW (ref 3.87–5.11)
RDW: 16.3 % — ABNORMAL HIGH (ref 11.5–15.5)
WBC: 9.3 10*3/uL (ref 4.0–10.5)
nRBC: 0 % (ref 0.0–0.2)

## 2020-12-25 LAB — RESP PANEL BY RT-PCR (FLU A&B, COVID) ARPGX2
Influenza A by PCR: NEGATIVE
Influenza B by PCR: NEGATIVE
SARS Coronavirus 2 by RT PCR: NEGATIVE

## 2020-12-25 MED ORDER — OXYCODONE-ACETAMINOPHEN 5-325 MG PO TABS: 1.0000 | ORAL_TABLET | ORAL | Status: DC | PRN

## 2020-12-25 MED ORDER — LACTATED RINGERS IV SOLN: 500.0000 mL | INTRAVENOUS | Status: DC | PRN

## 2020-12-25 MED ORDER — TERBUTALINE SULFATE 1 MG/ML IJ SOLN: 0.2500 mg | Freq: Once | INTRAMUSCULAR | Status: DC | PRN

## 2020-12-25 MED ORDER — ONDANSETRON HCL 4 MG/2ML IJ SOLN
4.0000 mg | Freq: Four times a day (QID) | INTRAMUSCULAR | Status: DC | PRN
  Administered 2020-12-26: 4 mg via INTRAVENOUS
  Filled 2020-12-25: qty 2

## 2020-12-25 MED ORDER — OXYCODONE-ACETAMINOPHEN 5-325 MG PO TABS: 2.0000 | ORAL_TABLET | ORAL | Status: DC | PRN

## 2020-12-25 MED ORDER — FLEET ENEMA 7-19 GM/118ML RE ENEM
1.0000 | ENEMA | RECTAL | Status: DC | PRN
Start: 2020-12-25 — End: 2020-12-26

## 2020-12-25 MED ORDER — ACETAMINOPHEN 325 MG PO TABS: 650.0000 mg | ORAL_TABLET | ORAL | Status: DC | PRN

## 2020-12-25 MED ORDER — OXYTOCIN-SODIUM CHLORIDE 30-0.9 UT/500ML-% IV SOLN
1.0000 m[IU]/min | INTRAVENOUS | Status: DC
  Administered 2020-12-26: 2 m[IU]/min via INTRAVENOUS

## 2020-12-25 MED ORDER — LACTATED RINGERS IV SOLN
INTRAVENOUS | Status: DC
  Administered 2020-12-25: 125 mL via INTRAVENOUS

## 2020-12-25 MED ORDER — OXYTOCIN BOLUS FROM INFUSION
333.0000 mL | Freq: Once | INTRAVENOUS | Status: AC
  Administered 2020-12-26: 333 mL via INTRAVENOUS

## 2020-12-25 MED ORDER — OXYTOCIN-SODIUM CHLORIDE 30-0.9 UT/500ML-% IV SOLN
2.5000 [IU]/h | INTRAVENOUS | Status: DC
  Filled 2020-12-25: qty 500

## 2020-12-25 MED ORDER — FENTANYL CITRATE (PF) 100 MCG/2ML IJ SOLN: 50.0000 ug | INTRAMUSCULAR | Status: DC | PRN

## 2020-12-25 MED ORDER — SOD CITRATE-CITRIC ACID 500-334 MG/5ML PO SOLN: 30.0000 mL | ORAL | Status: DC | PRN

## 2020-12-25 MED ORDER — LIDOCAINE HCL (PF) 1 % IJ SOLN: 30.0000 mL | INTRAMUSCULAR | Status: DC | PRN

## 2020-12-25 NOTE — Progress Notes (Signed)
No change to H&P per patient Hx D/W at length with patient IOL options. FHT cat one UCs irregular/mild Cx 3/50/-2/vtx AROM clear

## 2020-12-26 ENCOUNTER — Inpatient Hospital Stay (HOSPITAL_COMMUNITY): Payer: BC Managed Care – PPO | Admitting: Anesthesiology

## 2020-12-26 ENCOUNTER — Encounter (HOSPITAL_COMMUNITY): Payer: Self-pay | Admitting: Obstetrics and Gynecology

## 2020-12-26 LAB — SYPHILIS: RPR W/REFLEX TO RPR TITER AND TREPONEMAL ANTIBODIES, TRADITIONAL SCREENING AND DIAGNOSIS ALGORITHM: RPR Ser Ql: NONREACTIVE

## 2020-12-26 MED ORDER — LIDOCAINE-EPINEPHRINE (PF) 1.5 %-1:200000 IJ SOLN
INTRAMUSCULAR | Status: DC | PRN
  Administered 2020-12-26: 3 mL via EPIDURAL

## 2020-12-26 MED ORDER — ACETAMINOPHEN 325 MG PO TABS: 650.0000 mg | ORAL_TABLET | ORAL | Status: DC | PRN

## 2020-12-26 MED ORDER — FENTANYL-BUPIVACAINE-NACL 0.5-0.125-0.9 MG/250ML-% EP SOLN
EPIDURAL | Status: AC
  Filled 2020-12-26: qty 250

## 2020-12-26 MED ORDER — OXYCODONE HCL 5 MG PO TABS: 10.0000 mg | ORAL_TABLET | ORAL | Status: DC | PRN

## 2020-12-26 MED ORDER — TETANUS-DIPHTH-ACELL PERTUSSIS 5-2.5-18.5 LF-MCG/0.5 IM SUSY: 0.5000 mL | PREFILLED_SYRINGE | Freq: Once | INTRAMUSCULAR | Status: DC

## 2020-12-26 MED ORDER — PHENYLEPHRINE 40 MCG/ML (10ML) SYRINGE FOR IV PUSH (FOR BLOOD PRESSURE SUPPORT): 80.0000 ug | PREFILLED_SYRINGE | INTRAVENOUS | Status: DC | PRN

## 2020-12-26 MED ORDER — COCONUT OIL OIL
1.0000 "application " | TOPICAL_OIL | Status: DC | PRN
  Administered 2020-12-27: 1 via TOPICAL

## 2020-12-26 MED ORDER — DIBUCAINE (PERIANAL) 1 % EX OINT: 1.0000 "application " | TOPICAL_OINTMENT | CUTANEOUS | Status: DC | PRN

## 2020-12-26 MED ORDER — ONDANSETRON HCL 4 MG PO TABS: 4.0000 mg | ORAL_TABLET | ORAL | Status: DC | PRN

## 2020-12-26 MED ORDER — IBUPROFEN 600 MG PO TABS
600.0000 mg | ORAL_TABLET | Freq: Four times a day (QID) | ORAL | Status: DC
  Administered 2020-12-26 – 2020-12-27 (×5): 600 mg via ORAL
  Filled 2020-12-26 (×5): qty 1

## 2020-12-26 MED ORDER — OXYCODONE HCL 5 MG PO TABS: 5.0000 mg | ORAL_TABLET | ORAL | Status: DC | PRN

## 2020-12-26 MED ORDER — EPHEDRINE 5 MG/ML INJ
10.0000 mg | INTRAVENOUS | Status: DC | PRN
Start: 2020-12-26 — End: 2020-12-26
  Filled 2020-12-26: qty 5

## 2020-12-26 MED ORDER — PRENATAL MULTIVITAMIN CH
1.0000 | ORAL_TABLET | Freq: Every day | ORAL | Status: DC
  Administered 2020-12-26 – 2020-12-27 (×2): 1 via ORAL
  Filled 2020-12-26 (×2): qty 1

## 2020-12-26 MED ORDER — ONDANSETRON HCL 4 MG/2ML IJ SOLN: 4.0000 mg | INTRAMUSCULAR | Status: DC | PRN

## 2020-12-26 MED ORDER — BENZOCAINE-MENTHOL 20-0.5 % EX AERO
1.0000 "application " | INHALATION_SPRAY | CUTANEOUS | Status: DC | PRN
  Administered 2020-12-26: 1 via TOPICAL
  Filled 2020-12-26: qty 56

## 2020-12-26 MED ORDER — SIMETHICONE 80 MG PO CHEW: 80.0000 mg | CHEWABLE_TABLET | ORAL | Status: DC | PRN

## 2020-12-26 MED ORDER — EPHEDRINE 5 MG/ML INJ
10.0000 mg | INTRAVENOUS | Status: AC | PRN
  Administered 2020-12-26 (×2): 10 mg via INTRAVENOUS

## 2020-12-26 MED ORDER — DIPHENHYDRAMINE HCL 50 MG/ML IJ SOLN
12.5000 mg | INTRAMUSCULAR | Status: DC | PRN
Start: 2020-12-26 — End: 2020-12-26

## 2020-12-26 MED ORDER — ZOLPIDEM TARTRATE 5 MG PO TABS: 5.0000 mg | ORAL_TABLET | Freq: Every evening | ORAL | Status: DC | PRN

## 2020-12-26 MED ORDER — LACTATED RINGERS IV SOLN: 500.0000 mL | Freq: Once | INTRAVENOUS | Status: DC

## 2020-12-26 MED ORDER — DIPHENHYDRAMINE HCL 25 MG PO CAPS: 25.0000 mg | ORAL_CAPSULE | Freq: Four times a day (QID) | ORAL | Status: DC | PRN

## 2020-12-26 MED ORDER — FENTANYL-BUPIVACAINE-NACL 0.5-0.125-0.9 MG/250ML-% EP SOLN
12.0000 mL/h | EPIDURAL | Status: DC | PRN
Start: 2020-12-26 — End: 2020-12-26

## 2020-12-26 MED ORDER — SENNOSIDES-DOCUSATE SODIUM 8.6-50 MG PO TABS
2.0000 | ORAL_TABLET | ORAL | Status: DC
  Administered 2020-12-26 – 2020-12-27 (×2): 2 via ORAL
  Filled 2020-12-26 (×2): qty 2

## 2020-12-26 MED ORDER — WITCH HAZEL-GLYCERIN EX PADS: 1.0000 "application " | MEDICATED_PAD | CUTANEOUS | Status: DC | PRN

## 2020-12-26 MED ORDER — LIDOCAINE HCL (PF) 1 % IJ SOLN
INTRAMUSCULAR | Status: DC | PRN
  Administered 2020-12-26: 3 mL via EPIDURAL

## 2020-12-26 NOTE — Anesthesia Postprocedure Evaluation (Signed)
Anesthesia Post Note  Patient: Everlean Uno  Procedure(s) Performed: AN AD HOC LABOR EPIDURAL     Patient location during evaluation: Mother Baby Anesthesia Type: Epidural Level of consciousness: awake and alert and oriented Pain management: satisfactory to patient Vital Signs Assessment: post-procedure vital signs reviewed and stable Respiratory status: respiratory function stable Cardiovascular status: stable Postop Assessment: no headache, epidural receding, patient able to bend at knees, no signs of nausea or vomiting, adequate PO intake and able to ambulate Anesthetic complications: no Comments: The patient complained of tenderness at the epidural insertion site and and asked if this was normal. She said that it was her first epidural and she wasn't sure what to expect. I reassured her that some tenderness was normal and that if she had further concerns to have her nurse contact the [facility] and he/she would be happy to see her.   No notable events documented.  Last Vitals:  Vitals:   12/26/20 0900 12/26/20 1354  BP: 117/68 113/66  Pulse: 80 68  Resp: 17 17  Temp: 36.8 C 36.9 C  SpO2: 97%     Last Pain:  Vitals:   12/26/20 1354  TempSrc: Oral  PainSc: 3    Pain Goal:                   Truxton Stupka

## 2020-12-26 NOTE — Lactation Note (Signed)
This note was copied from a baby's chart. Lactation Consultation Note  Patient Name: Crystal Humphrey [id number] Date: 12/26/2020 Reason for consult: Initial assessment;Term;Other (Comment) (P 3/ mom encouraged to call with feeding cues for latch assessment)Thyroidectomy  Age:34 hours Baby asleep in the crib. LC reviewed doc flow sheets and baby last fed at 1115am for 20 mins . Per mom the latch had some pinching and when the baby released the nipple was flatten.  Doc flow sheets WNL for baby's age.  LC recommended to call when baby is showing feeding cues after mom eats her lunch.  LC provided the [facility] brochure with resources and BFSG.  Maternal Data    Feeding Mother's Current Feeding Choice: Breast Milk  LATCH Score                    Lactation Tools Discussed/Used    Interventions Interventions: Breast feeding basics reviewed;Education  Discharge    Consult Status Consult Status: Follow-up Date: 12/27/20 Follow-up type: In-patient    [place] 12/26/2020, 2:26 PM

## 2020-12-26 NOTE — Lactation Note (Signed)
This note was copied from a baby's chart. Lactation Consultation Note  Patient Name: Crystal Humphrey [id number] Date: 12/26/2020 Reason for consult: Initial assessment;Term;Maternal endocrine disorder Age:34 hours  LC in to visit with P3 Mom of term infant. Mom received help from her RN Media planner) and baby was latched well in cross cradle hold. Baby noted to have deep jaw extensions and Mom denies any discomfort. Encouraged STS and offering the breast with a deep latch with cues.  Lactation brochure provided and Mom aware of IP and OP Lactation support available.  Maternal Data Has patient been taught Hand Expression?: Yes Does the patient have breastfeeding experience prior to this delivery?: Yes  Feeding Mother's Current Feeding Choice: Breast Milk  LATCH Score Latch: Grasps breast easily, tongue down, lips flanged, rhythmical sucking.  Audible Swallowing: A few with stimulation  Type of Nipple: Everted at rest and after stimulation  Comfort (Breast/Nipple): Soft / non-tender  Hold (Positioning): Assistance needed to correctly position infant at breast and maintain latch.  LATCH Score: 8    Interventions Interventions: Breast feeding basics reviewed;Skin to skin;Breast massage;Hand express;Adjust position;Support pillows;Position options;Breast compression Consult Status Consult Status: Follow-up Date: 12/27/20 Follow-up type: In-patient    Broadus John 12/26/2020, 4:23 PM

## 2020-12-26 NOTE — Lactation Note (Signed)
This note was copied from a baby's chart. Lactation Consultation Note  Patient Name: Crystal Humphrey [id number] Date: 12/26/2020 Reason for consult: L&D Initial assessment;Other (Comment) (Baby 1 hour - LC unable to see mom at this time / per RN mom in the middle of tx  . LC will F/U) Age:34 hours- baby has been to the breast x 1 - Latch score 8   Maternal Data    Feeding    LATCH Score ( Latch score by the LD RN )  Latch: Repeated attempts needed to sustain latch, nipple held in mouth throughout feeding, stimulation needed to elicit sucking reflex.  Audible Swallowing: A few with stimulation  Type of Nipple: Everted at rest and after stimulation  Comfort (Breast/Nipple): Soft / non-tender  Hold (Positioning): No assistance needed to correctly position infant at breast.  LATCH Score: 8   Lactation Tools Discussed/Used    Interventions    Discharge    Consult Status Consult Status: Follow-up from L&D Date: 12/26/20 Follow-up type: In-patient    [place] 12/26/2020, 7:38 AM

## 2020-12-26 NOTE — Anesthesia Preprocedure Evaluation (Signed)
Anesthesia Evaluation  Patient identified by MRN, date of birth, ID band Patient awake    Reviewed: Allergy & Precautions, Patient's Chart, lab work & pertinent test results  History of Anesthesia Complications (+) PONV and history of anesthetic complications  Airway Mallampati: I  TM Distance: >3 FB Neck ROM: Full    Dental no notable dental hx.    Pulmonary    Pulmonary exam normal        Cardiovascular Normal cardiovascular exam     Neuro/Psych  Headaches,    GI/Hepatic   Endo/Other    Renal/GU      Musculoskeletal   Abdominal   Peds  Hematology   Anesthesia Other Findings   Reproductive/Obstetrics (+) Pregnancy                             Anesthesia Physical  Anesthesia Plan  ASA: 2  Anesthesia Plan: Epidural   Post-op Pain Management:    Induction:   PONV Risk Score and Plan:   Airway Management Planned:   Additional Equipment:   Intra-op Plan:   Post-operative Plan:   Informed Consent: I have reviewed the patients History and Physical, chart, labs and discussed the procedure including the risks, benefits and alternatives for the proposed anesthesia with the patient or authorized representative who has indicated his/her understanding and acceptance.       Plan Discussed with:   Anesthesia Plan Comments:         Anesthesia Quick Evaluation

## 2020-12-26 NOTE — Lactation Note (Signed)
This note was copied from a baby's chart. Lactation Consultation Note  Patient Name: Crystal Humphrey [id number] Date: 12/26/2020 Reason for consult: Follow-up assessment;Term (2nd [place] visit / mom called with feeding cues / baby woke back up and attempted to latch and the baby not interested. baby STS with mom) Age:34 hours  Maternal Data Has patient been taught Hand Expression?: Yes Does the patient have breastfeeding experience prior to this delivery?: Yes NO meds ordered for Thyroid per MBURN.  Feeding Mother's Current Feeding Choice: Breast Milk  LATCH Score Latch: Repeated attempts needed to sustain latch, nipple held in mouth throughout feeding, stimulation needed to elicit sucking reflex.  Audible Swallowing: None  Type of Nipple: Everted at rest and after stimulation  Comfort (Breast/Nipple): Soft / non-tender  Hold (Positioning): Assistance needed to correctly position infant at breast and maintain latch.  LATCH Score: 6   Lactation Tools Discussed/Used    Interventions Interventions: Breast feeding basics reviewed;Education;Breast compression;Adjust position;Support pillows;Position options  Discharge    Consult Status Consult Status: Follow-up Date: 12/26/20 Follow-up type: In-patient    [place] 12/26/2020, 3:58 PM

## 2020-12-26 NOTE — Progress Notes (Signed)
Delivery Note At 6:13 AM a viable female was delivered via Vaginal, Spontaneous (Presentation: Left Occiput Anterior).  APGAR: 9, 9; weight  .   Placenta status: Spontaneous, Intact and to path.  Cord: 3 vessels with the following complications: None.  Cord pH:  Terminal meconium Anesthesia: Epidural Episiotomy: small second degree MLE repaired Lacerations: None Suture Repair: 2.0 vicryl rapide Est. Blood Loss (mL):    Mom to postpartum.  Baby to Couplet care / Skin to Skin.  [name] 12/26/2020, 6:29 AM

## 2020-12-26 NOTE — Anesthesia Procedure Notes (Addendum)
Epidural Patient location during procedure: OB Start time: 12/26/2020 2:36 AM End time: 12/26/2020 2:59 AM  Staffing Anesthesiologist: Nolon Nations, MD Performed: anesthesiologist   Preanesthetic Checklist Completed: patient identified, IV checked, risks and benefits discussed, monitors and equipment checked, pre-op evaluation and timeout performed  Epidural Patient position: sitting Prep: DuraPrep and site prepped and draped Patient monitoring: heart rate, continuous pulse ox and blood pressure Approach: midline Location: L2-L3 Injection technique: LOR air and LOR saline  Needle:  Needle type: Tuohy  Needle gauge: 17 G Needle length: 9 cm Needle insertion depth: 5 cm Catheter type: closed end flexible Catheter size: 19 Gauge Catheter at skin depth: 11 cm Test dose: negative  Assessment Sensory level: T8 Events: blood not aspirated, injection not painful, no injection resistance, no paresthesia and negative IV test  Additional Notes Reason for block:procedure for pain

## 2020-12-27 LAB — CBC
HCT: 30.8 % — ABNORMAL LOW (ref 36.0–46.0)
Hemoglobin: 10.3 g/dL — ABNORMAL LOW (ref 12.0–15.0)
MCH: 31.9 pg (ref 26.0–34.0)
MCHC: 33.4 g/dL (ref 30.0–36.0)
MCV: 95.4 fL (ref 80.0–100.0)
Platelets: 235 10*3/uL (ref 150–400)
RBC: 3.23 MIL/uL — ABNORMAL LOW (ref 3.87–5.11)
RDW: 16.6 % — ABNORMAL HIGH (ref 11.5–15.5)
WBC: 12.6 10*3/uL — ABNORMAL HIGH (ref 4.0–10.5)
nRBC: 0 % (ref 0.0–0.2)

## 2020-12-27 LAB — SURGICAL PATHOLOGY

## 2020-12-27 MED ORDER — IBUPROFEN 600 MG PO TABS: 600.0000 mg | ORAL_TABLET | Freq: Four times a day (QID) | ORAL | 0 refills | Status: AC

## 2020-12-27 MED ORDER — ACETAMINOPHEN 325 MG PO TABS: 650.0000 mg | ORAL_TABLET | ORAL | 0 refills | Status: DC | PRN

## 2020-12-27 NOTE — Progress Notes (Signed)
Postpartum Progress Note  Post Partum Day 1 s/p spontaneous vaginal delivery.  Patient reports well-controlled pain, ambulating without difficulty, voiding spontaneously, tolerating PO.  Vaginal bleeding is appropriate.   Objective: Blood pressure 112/77, pulse 69, temperature 98.1 F (36.7 C), temperature source Oral, resp. rate 18, height '5\' 5"'$  (1.651 m), weight 85.8 kg, SpO2 100 %, unknown if currently breastfeeding.  Physical Exam:  General: alert and no distress Lochia: appropriate Uterine Fundus: firm DVT Evaluation: No evidence of DVT seen on physical exam.  Recent Labs    12/25/20 1825 12/27/20 0443  HGB 11.6* 10.3*  HCT 34.8* 30.8*    Assessment/Plan: Postpartum Day 1, s/p vaginal delivery. Continue routine postpartum care Baby boy - will perform circ today if cleared Lactation following Anticipate discharge home today   LOS: 2 days   Carlyon Shadow 12/27/2020, 9:27 AM

## 2020-12-27 NOTE — Lactation Note (Signed)
This note was copied from a baby's chart. Lactation Consultation Note  Patient Name: Boy Maansi Unzueta [id number] Date: 12/27/2020 Reason for consult: Follow-up assessment;Nipple pain/trauma;Term;Maternal endocrine disorder Age:34 hours  LC in to visit with P3 Mom of term baby on day of discharge.  Baby at 6.5% weight loss.  Mom has been exclusively breastfeeding but states she is having some discomfort with latching and nipple pinching when baby comes off.  Mom states baby isn't opening his mouth wide enough.    Baby had a circumcision this am and has been sleepy.  Baby cueing and offered to assist/assess with positioning and latching.  Assisted Mom in using cross cradle hold.  Nipples without visible trauma.  Baby noted to have a high palate.  Baby latched after several attempts and needed to have his chin tugged to open his mouth wider and flange his lower lip.  Baby fed with deep jaw extensions and swallowing identified.  Mom taught to use breast compression during feeding.  When baby came off, nipple everted more and slight pinching noted. Watched Mom latch baby on 2nd breast more independently.  Showed FOB how to pull on chin to open mouth wider on breast and flange lower lip.   Encouraged STS and offering the breast with cues.   Engorgement prevention and treatment reviewed. Encouraged Mom to make an OP lactation appt and offered to send message to clinic to set it up, but Mom states she will see how she does and call from home.  Mom aware of OP lactation support and encouraged to call prn   LATCH Score Latch: Grasps breast easily, tongue down, lips flanged, rhythmical sucking.  Audible Swallowing: Spontaneous and intermittent  Type of Nipple: Everted at rest and after stimulation  Comfort (Breast/Nipple): Filling, red/small blisters or bruises, mild/mod discomfort  Hold (Positioning): Assistance needed to correctly position infant at breast and maintain latch.  LATCH Score:  8   Interventions Interventions: Breast feeding basics reviewed;Assisted with latch;Skin to skin;Breast massage;Hand express;Breast compression;Adjust position;Support pillows;Position options;Coconut oil;Education  Discharge Discharge Education: Engorgement and breast care;Outpatient recommendation  Consult Status Consult Status: Complete (mother declined follow up) Date: 12/27/20 Follow-up type: Call as needed    Broadus John 12/27/2020, 3:10 PM

## 2020-12-27 NOTE — Discharge Summary (Signed)
Obstetric Discharge Summary  Crystal Humphrey is a 35 y.o. female that presented on 12/25/2020 for IOL at term.  She was admitted to labor and delivery for her induction.  Her labor course was uncomplicated and she delivered a viable female infant on 12/26/20.  Her postpartum course was uncomplicated and on PPD#1, she reported well controlled pain, spontaneous voiding, ambulating without difficulty, and tolerating PO.  She was stable for discharge home on 12/27/20 with plans for in-office follow up.  Hemoglobin  Date Value Ref Range Status  12/27/2020 10.3 (L) 12.0 - 15.0 g/dL Final   HCT  Date Value Ref Range Status  12/27/2020 30.8 (L) 36.0 - 46.0 % Final    Physical Exam:  General: alert and no distress Lochia: appropriate Uterine Fundus: firm DVT Evaluation: No evidence of DVT seen on physical exam.  Discharge Diagnoses: Term Pregnancy-delivered  Discharge Information: Date: 12/27/2020 Activity: Pelvic rest, as tolerated Diet: routine Medications: Tylenol, motrin Condition: stable Instructions: Refer to practice specific booklet.  Discussed prior to discharge.  Discharge to: [place] Follow up.   Why: Please follow up for 6 week postpartum visit. Contact information: [place] [address] [phone]                 Newborn Data: Live born female  Birth Weight: 8 lb 6.6 oz (3816 g) APGAR: 41, 9  Newborn Delivery   Birth date/time: 12/26/2020 06:13:00 Delivery type: Vaginal, Spontaneous      Home with mother.  Carlyon Shadow 12/27/2020, 8:53 PM

## 2021-01-09 ENCOUNTER — Telehealth (HOSPITAL_COMMUNITY): Payer: Self-pay | Admitting: *Deleted

## 2021-01-09 NOTE — Telephone Encounter (Signed)
Mom reports feeling good, but tired. Baby not sleeping much at night. No concerns about herself. EPDS = 4 (No hospital score) Baby is doing well. Feeding, peeing, and pooping without difficulty. No concerns about baby.  Odis Hollingshead, RN 01-09-2021 at 10:43a

## 2021-03-06 ENCOUNTER — Other Ambulatory Visit: Payer: Self-pay | Admitting: Surgery

## 2021-03-06 DIAGNOSIS — E041 Nontoxic single thyroid nodule: Secondary | ICD-10-CM

## 2021-03-14 ENCOUNTER — Telehealth (INDEPENDENT_AMBULATORY_CARE_PROVIDER_SITE_OTHER): Payer: BC Managed Care – PPO | Admitting: Adult Health

## 2021-03-14 ENCOUNTER — Telehealth: Payer: Self-pay

## 2021-03-14 ENCOUNTER — Encounter: Payer: Self-pay | Admitting: Adult Health

## 2021-03-14 ENCOUNTER — Telehealth: Payer: Self-pay | Admitting: Family

## 2021-03-14 VITALS — Ht 65.0 in | Wt 156.0 lb

## 2021-03-14 DIAGNOSIS — J0140 Acute pansinusitis, unspecified: Secondary | ICD-10-CM | POA: Diagnosis not present

## 2021-03-14 DIAGNOSIS — R0989 Other specified symptoms and signs involving the circulatory and respiratory systems: Secondary | ICD-10-CM

## 2021-03-14 MED ORDER — AMOXICILLIN 875 MG PO TABS: 875.0000 mg | ORAL_TABLET | Freq: Two times a day (BID) | ORAL | 0 refills | Status: AC

## 2021-03-14 NOTE — Progress Notes (Signed)
Virtual Visit via Telephone Note  I connected with Crystal Humphrey on 03/14/21 at  4:30 PM EST by telephone and verified that I am speaking with the correct person using two identifiers.  Location: Patient: at home  Provider: office    I discussed the limitations, risks, security and privacy concerns of performing an evaluation and management service by telephone and the availability of in person appointments. I also discussed with the patient that there may be a patient responsible charge related to this service. The patient expressed understanding and agreed to proceed.   History of Present Illness: Patient has runny nose, body aches, scratch sore throat and chills started last night.  She reports she has watery mucous/ yellow/orange drainage. This has resolved since Tuesday night now.  She has post nasal drip.  Denies any head injury/nasal trauma.  Cough. She feels she has been fighting off the infection for the past two weeks. She feels she was getting better and then started with these symptoms 03/12/21/  Mild headache sinus pressure.  Denies fever. She is taking a orange/ yellow prenatal.  Onset was 03/12/21  Lactating.   Lmp was less than a month  ago denies pregnancy.   Patient  denies any fever, rash, chest pain, shortness of breath, nausea, vomiting, or diarrhea.    Observations/Objective:   Patient is alert and oriented and responsive to questions Engages in conversation with provider. Speaks in full sentences without any pauses without any shortness of breath or distress.    Assessment and Plan:   Acute non-recurrent pansinusitis - Plan: amoxicillin (AMOXIL) 875 MG tablet   Red Flags discussed. The patient was given clear instructions to go to ER or return to medical center if any red flags develop, symptoms do not improve, worsen or new problems develop. They verbalized understanding. Will go ahead and treat since she was sick for two weeks and possible secondary  infection/  Covid/ flu and rsv testing 03/15/21 Follow Up Instructions:   Return in about 3 days (around 03/17/2021), or if symptoms worsen or fail to improve, for at any time for any worsening symptoms, Go to Emergency room/ urgent care if worse.  I discussed the assessment and treatment plan with the patient. The patient was provided an opportunity to ask questions and all were answered. The patient agreed with the plan and demonstrated an understanding of the instructions.   The patient was advised to call back or seek an in-person evaluation if the symptoms worsen or if the condition fails to improve as anticipated.  Red Flags discussed. The patient was given clear instructions to go to ER or return to medical center if any red flags develop, symptoms do not improve, worsen or new problems develop. They verbalized understanding.   I provided 20 minutes of non-face-to-face time during this encounter.   Marcille Buffy, FNP

## 2021-03-14 NOTE — Telephone Encounter (Signed)
Pt stated that she had fluid come out of nose that was very much more liquid like much thinner than mucus. She said that it was such an odd color she was concerned. Pt stated that today she is feeling achy, skin sore to touch & throat is sore with some drainage. VV was offered & scheduled with Sharyn Lull.

## 2021-03-14 NOTE — Addendum Note (Signed)
Addended by: Baron Hamper on: 03/14/2021 01:14 PM   Modules accepted: Orders

## 2021-03-14 NOTE — Patient Instructions (Addendum)
Red Flags discussed. The patient was given clear instructions to go to ER or return to medical center if any red flags develop, symptoms do not improve, worsen or new problems develop. They verbalized understanding.    Amoxicillin Capsules or Tablets What is this medication? AMOXICILLIN (a mox i SIL in) treats infections caused by bacteria. It belongs to a group of medications called penicillin antibiotics. It will not treat colds, the flu, or infections caused by viruses. This medicine may be used for other purposes; ask your health care provider or pharmacist if you have questions. COMMON BRAND NAME(S): Amoxil, Moxilin, Sumox, Trimox What should I tell my care team before I take this medication? They need to know if you have any of these conditions: Kidney disease An unusual or allergic reaction to amoxicillin, other penicillins, cephalosporin antibiotics, other medications, foods, dyes, or preservatives Pregnant or trying to get pregnant Breast-feeding How should I use this medication? Take this medication by mouth with a glass of water. Follow the directions on your prescription label. You can take it with or without food. If it upsets your stomach, take it with food. Take your medication at regular intervals. Do not take your medication more often than directed. Take all of your medication as directed even if you think you are better. Do not skip doses or stop your medication early. Talk to your pediatrician regarding the use of this medication in children. While this medication may be prescribed for selected conditions, precautions do apply. Overdosage: If you think you have taken too much of this medicine contact a poison control center or emergency room at once. NOTE: This medicine is only for you. Do not share this medicine with others. What if I miss a dose? If you miss a dose, take it as soon as you can. If it is almost time for your next dose, take only that dose. Do not take double  or extra doses. What may interact with this medication? Allopurinol Birth control pills Certain antibiotics like chloramphenicol, erythromycin, sulfamethoxazole, tetracycline Certain medications that treat or prevent blood clots like warfarin This list may not describe all possible interactions. Give your health care provider a list of all the medicines, herbs, non-prescription drugs, or dietary supplements you use. Also tell them if you smoke, drink alcohol, or use illegal drugs. Some items may interact with your medicine. What should I watch for while using this medication? Tell your care team if your symptoms do not start to get better or if they get worse. Do not treat diarrhea with over the counter products. Contact your health care professional if you have diarrhea that lasts more than 2 days or if it is severe and watery. If you have diabetes, you may get a false-positive result for sugar in your urine. Check with your health care professional. Birth control may not work properly while you are taking this medication. Talk to your health care professional about using an extra method of birth control. This medication may cause serious skin reactions. They can happen weeks to months after starting the medication. Contact your health care provider right away if you notice fevers or flu-like symptoms with a rash. The rash may be red or purple and then turn into blisters or peeling of the skin. Or, you might notice a red rash with swelling of the face, lips or lymph nodes in your neck or under your arms. What side effects may I notice from receiving this medication? Side effects that you should report to  your care team as soon as possible: Allergic reactions--skin rash, itching, hives, swelling of the face, lips, tongue, or throat Redness, blistering, peeling, or loosening of the skin, including inside the mouth Severe diarrhea, fever Unusual vaginal discharge, itching, or odor Side effects that  usually do not require medical attention (report to your care team if they continue or are bothersome): Diarrhea Headache Nausea Vomiting This list may not describe all possible side effects. Call your doctor for medical advice about side effects. You may report side effects to FDA at 1-800-FDA-1088. Where should I keep my medication? Keep out of the reach of children. Store at room temperature between 20 and 25 degrees C (68 and 77 degrees F). Throw away any unused medication after the expiration date. NOTE: This sheet is a summary. It may not cover all possible information. If you have questions about this medicine, talk to your doctor, pharmacist, or health care provider.  2022 Elsevier/Gold Standard (2020-05-01 00:00:00) Sinusitis, Adult Sinusitis is inflammation of your sinuses. Sinuses are hollow spaces in the bones around your face. Your sinuses are located: Around your eyes. In the middle of your forehead. Behind your nose. In your cheekbones. Mucus normally drains out of your sinuses. When your nasal tissues become inflamed or swollen, mucus can become trapped or blocked. This allows bacteria, viruses, and fungi to grow, which leads to infection. Most infections of the sinuses are caused by a virus. Sinusitis can develop quickly. It can last for up to 4 weeks (acute) or for more than 12 weeks (chronic). Sinusitis often develops after a cold. What are the causes? This condition is caused by anything that creates swelling in the sinuses or stops mucus from draining. This includes: Allergies. Asthma. Infection from bacteria or viruses. Deformities or blockages in your nose or sinuses. Abnormal growths in the nose (nasal polyps). Pollutants, such as chemicals or irritants in the air. Infection from fungi (rare). What increases the risk? You are more likely to develop this condition if you: Have a weak body defense system (immune system). Do a lot of swimming or diving. Overuse  nasal sprays. Smoke. What are the signs or symptoms? The main symptoms of this condition are pain and a feeling of pressure around the affected sinuses. Other symptoms include: Stuffy nose or congestion. Thick drainage from your nose. Swelling and warmth over the affected sinuses. Headache. Upper toothache. A cough that may get worse at night. Extra mucus that collects in the throat or the back of the nose (postnasal drip). Decreased sense of smell and taste. Fatigue. A fever. Sore throat. Bad breath. How is this diagnosed? This condition is diagnosed based on: Your symptoms. Your medical history. A physical exam. Tests to find out if your condition is acute or chronic. This may include: Checking your nose for nasal polyps. Viewing your sinuses using a device that has a light (endoscope). Testing for allergies or bacteria. Imaging tests, such as an MRI or CT scan. In rare cases, a bone biopsy may be done to rule out more serious types of fungal sinus disease. How is this treated? Treatment for sinusitis depends on the cause and whether your condition is chronic or acute. If caused by a virus, your symptoms should go away on their own within 10 days. You may be given medicines to relieve symptoms. They include: Medicines that shrink swollen nasal passages (topical intranasal decongestants). Medicines that treat allergies (antihistamines). A spray that eases inflammation of the nostrils (topical intranasal corticosteroids). Rinses that help get  rid of thick mucus in your nose (nasal saline washes). If caused by bacteria, your health care provider may recommend waiting to see if your symptoms improve. Most bacterial infections will get better without antibiotic medicine. You may be given antibiotics if you have: A severe infection. A weak immune system. If caused by narrow nasal passages or nasal polyps, you may need to have surgery. Follow these instructions at  home: Medicines Take, use, or apply over-the-counter and prescription medicines only as told by your health care provider. These may include nasal sprays. If you were prescribed an antibiotic medicine, take it as told by your health care provider. Do not stop taking the antibiotic even if you start to feel better. Hydrate and humidify  Drink enough fluid to keep your urine pale yellow. Staying hydrated will help to thin your mucus. Use a cool mist humidifier to keep the humidity level in your home above 50%. Inhale steam for 10-15 minutes, 3-4 times a day, or as told by your health care provider. You can do this in the bathroom while a hot shower is running. Limit your exposure to cool or dry air. Rest Rest as much as possible. Sleep with your head raised (elevated). Make sure you get enough sleep each night. General instructions  Apply a warm, moist washcloth to your face 3-4 times a day or as told by your health care provider. This will help with discomfort. Wash your hands often with soap and water to reduce your exposure to germs. If soap and water are not available, use hand sanitizer. Do not smoke. Avoid being around people who are smoking (secondhand smoke). Keep all follow-up visits as told by your health care provider. This is important. Contact a health care provider if: You have a fever. Your symptoms get worse. Your symptoms do not improve within 10 days. Get help right away if: You have a severe headache. You have persistent vomiting. You have severe pain or swelling around your face or eyes. You have vision problems. You develop confusion. Your neck is stiff. You have trouble breathing. Summary Sinusitis is soreness and inflammation of your sinuses. Sinuses are hollow spaces in the bones around your face. This condition is caused by nasal tissues that become inflamed or swollen. The swelling traps or blocks the flow of mucus. This allows bacteria, viruses, and fungi to  grow, which leads to infection. If you were prescribed an antibiotic medicine, take it as told by your health care provider. Do not stop taking the antibiotic even if you start to feel better. Keep all follow-up visits as told by your health care provider. This is important. This information is not intended to replace advice given to you by your health care provider. Make sure you discuss any questions you have with your health care provider. Document Revised: 09/21/2017 Document Reviewed: 09/21/2017 Elsevier Patient Education  2022 [employer].

## 2021-03-14 NOTE — Telephone Encounter (Signed)
Placed call to pt to get her scheduled for drive up covid testing for today before her visit or tomorrow. LMTCB

## 2021-03-14 NOTE — Telephone Encounter (Signed)
Pt called in stating that Tuesday night she was bending over to do laundry and her noise started running. Pt  stated that Mucus from her noise dripped onto her hand and the color of the mucus was bright yellow/orangish. Pt stated should she be concern about the color of her mucus. Pt was wondering should she make an appointment to see FNP Arnett. Pt stated that she hasn't been feeling well. Pt would like callback

## 2021-03-15 ENCOUNTER — Other Ambulatory Visit: Payer: BC Managed Care – PPO

## 2021-03-15 DIAGNOSIS — R0989 Other specified symptoms and signs involving the circulatory and respiratory systems: Secondary | ICD-10-CM

## 2021-03-17 LAB — COVID-19, FLU A+B AND RSV
Influenza A, NAA: NOT DETECTED
Influenza B, NAA: NOT DETECTED
RSV, NAA: NOT DETECTED
SARS-CoV-2, NAA: NOT DETECTED

## 2021-03-18 NOTE — Progress Notes (Signed)
Negative covid, influenza or rsv.  Follow up if not improving at anytime.

## 2021-03-27 ENCOUNTER — Other Ambulatory Visit: Payer: BC Managed Care – PPO

## 2021-04-08 ENCOUNTER — Encounter: Payer: Self-pay | Admitting: Adult Health

## 2021-04-09 ENCOUNTER — Encounter: Payer: Self-pay | Admitting: Family

## 2021-04-09 ENCOUNTER — Ambulatory Visit: Payer: BC Managed Care – PPO | Admitting: Internal Medicine

## 2021-04-09 ENCOUNTER — Encounter: Payer: Self-pay | Admitting: Internal Medicine

## 2021-04-09 ENCOUNTER — Other Ambulatory Visit: Payer: Self-pay

## 2021-04-09 DIAGNOSIS — J0110 Acute frontal sinusitis, unspecified: Secondary | ICD-10-CM

## 2021-04-09 MED ORDER — AMOXICILLIN-POT CLAVULANATE 875-125 MG PO TABS: 1.0000 | ORAL_TABLET | Freq: Two times a day (BID) | ORAL | 0 refills | Status: DC

## 2021-04-09 NOTE — Patient Instructions (Signed)
We have sent in augmentin to take 1 pill twice a day for 10 days for the sinuses.

## 2021-04-09 NOTE — Assessment & Plan Note (Signed)
Rx augmentin which is safe in breastfeeding or pregnancy (missed OCP pill recently). Advised she can still use otc mucinex if desired. She does not want to take zyrtec due to concerns about this drying up her breastmilk.

## 2021-04-09 NOTE — Progress Notes (Signed)
   Subjective:   Patient ID: Crystal Humphrey, female    DOB: 05-Apr-1986, 35 y.o.   MRN: [id number]  HPI The patient is a 35 YO female coming in for sinus and cold symptoms for about a month or so. Did amoxicillin and this helped some. 32 month old son with RSV recently and she got sick again after that.   Review of Systems  Constitutional:  Positive for activity change. Negative for appetite change, chills, fatigue, fever and unexpected weight change.  HENT:  Positive for congestion, postnasal drip, rhinorrhea and sinus pressure. Negative for ear discharge, ear pain, sinus pain, sneezing, sore throat, tinnitus, trouble swallowing and voice change.   Eyes: Negative.   Respiratory:  Positive for cough. Negative for chest tightness, shortness of breath and wheezing.   Cardiovascular: Negative.   Gastrointestinal: Negative.   Neurological:  Positive for headaches.   Objective:  Physical Exam Constitutional:      Appearance: She is well-developed.  HENT:     Head: Normocephalic and atraumatic.     Comments: Oropharynx with redness and clear drainage, nose with swollen turbinates, TMs normal bilaterally.  Neck:     Thyroid: No thyromegaly.  Cardiovascular:     Rate and Rhythm: Normal rate and regular rhythm.  Pulmonary:     Effort: Pulmonary effort is normal. No respiratory distress.     Breath sounds: Normal breath sounds. No wheezing or rales.  Abdominal:     Palpations: Abdomen is soft.  Musculoskeletal:        General: No tenderness.     Cervical back: Normal range of motion.  Lymphadenopathy:     Cervical: No cervical adenopathy.  Skin:    General: Skin is warm and dry.  Neurological:     Mental Status: She is alert and oriented to person, place, and time.    Vitals:   04/09/21 1048  BP: 120/72  Pulse: 84  Resp: 18  Temp: 98.2 F (36.8 C)  TempSrc: Oral  SpO2: 97%  Weight: 156 lb 6.4 oz (70.9 kg)  Height: 5\' 5"  (1.651 m)    This visit occurred during the  SARS-CoV-2 public health emergency.  Safety protocols were in place, including screening questions prior to the visit, additional usage of staff PPE, and extensive cleaning of exam room while observing appropriate contact time as indicated for disinfecting solutions.   Assessment & Plan:

## 2021-04-10 ENCOUNTER — Telehealth: Payer: Self-pay | Admitting: Family

## 2021-04-10 ENCOUNTER — Ambulatory Visit: Payer: BC Managed Care – PPO | Admitting: Adult Health

## 2021-04-10 NOTE — Telephone Encounter (Signed)
Patient Called in was seen on yesterday at the [facility] location  and started antibiotic last night Amoxicillin and Clazulanate  and having pink eye red eye and crusted discharge, think result of sinus infection. Have some eyedrops that prescribe for her kids is okay take them   xfer to Access Nurse

## 2021-04-10 NOTE — Telephone Encounter (Signed)
Spoken to patient. Someone has spoken with her already and answered her questions.

## 2021-04-10 NOTE — Telephone Encounter (Signed)
Call pt  Unfortunately I cannot treat patients that have not seen for a particular issue.  Please see if any appts in this office.  Please direct her on how to long onto virtual MyChart visit, or advise in person UC.

## 2021-05-14 ENCOUNTER — Other Ambulatory Visit: Payer: BC Managed Care – PPO

## 2021-05-16 ENCOUNTER — Ambulatory Visit
Admission: RE | Admit: 2021-05-16 | Discharge: 2021-05-16 | Disposition: A | Discharge status: Home / Self Care | Payer: BC Managed Care – PPO | Source: Ambulatory Visit | Attending: Surgery | Admitting: Surgery

## 2021-05-16 DIAGNOSIS — E041 Nontoxic single thyroid nodule: Secondary | ICD-10-CM

## 2021-05-19 NOTE — Progress Notes (Signed)
Good news on ultrasound.  No new or suspicious findings after thyroid isthmusectomy.  [place], MD [facility] [place] practice Office: [phone]

## 2022-02-07 DIAGNOSIS — Z309 Encounter for contraceptive management, unspecified: Secondary | ICD-10-CM | POA: Diagnosis not present

## 2022-02-07 DIAGNOSIS — Z124 Encounter for screening for malignant neoplasm of cervix: Secondary | ICD-10-CM | POA: Diagnosis not present

## 2022-02-07 DIAGNOSIS — N819 Female genital prolapse, unspecified: Secondary | ICD-10-CM | POA: Diagnosis not present

## 2022-02-07 DIAGNOSIS — Z01419 Encounter for gynecological examination (general) (routine) without abnormal findings: Secondary | ICD-10-CM | POA: Diagnosis not present

## 2022-02-07 DIAGNOSIS — Z6824 Body mass index (BMI) 24.0-24.9, adult: Secondary | ICD-10-CM | POA: Diagnosis not present

## 2022-03-12 IMAGING — US US THYROID
1 series · 14 of 25 positions shown · non-contrast
Comparison: Ultrasound dated May 03, 2018.

CLINICAL DATA: Thyroid cancer status post partial thyroidectomy

EXAM:
THYROID ULTRASOUND
TECHNIQUE: Ultrasound examination of the thyroid gland and adjacent soft
tissues was performed.

[Series 1: us thyroid · 0.07mm/px · 44 acquisitions, 14 frames shown]
[im 1/44]
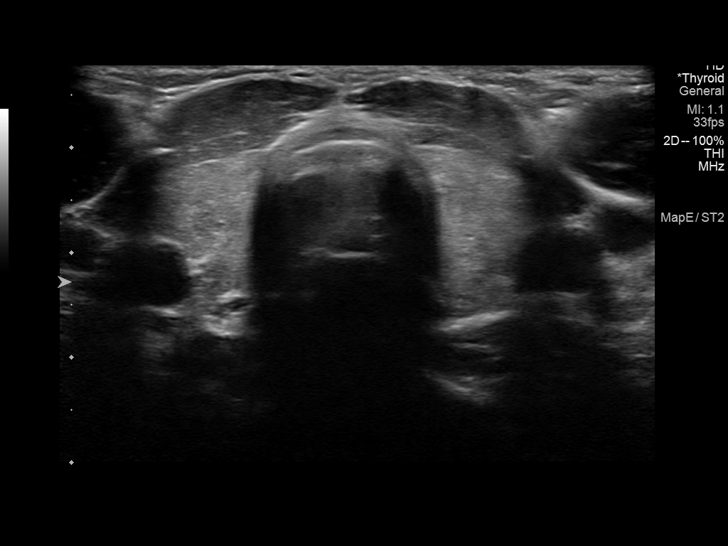
[im 4/44]
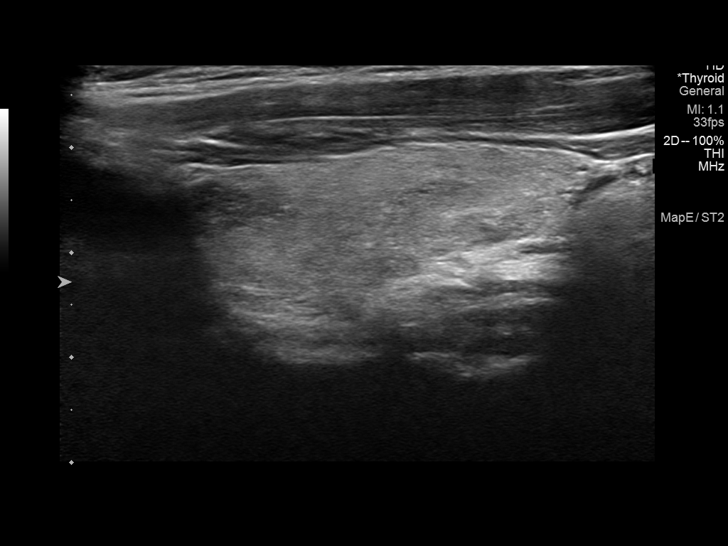
[im 8/44]
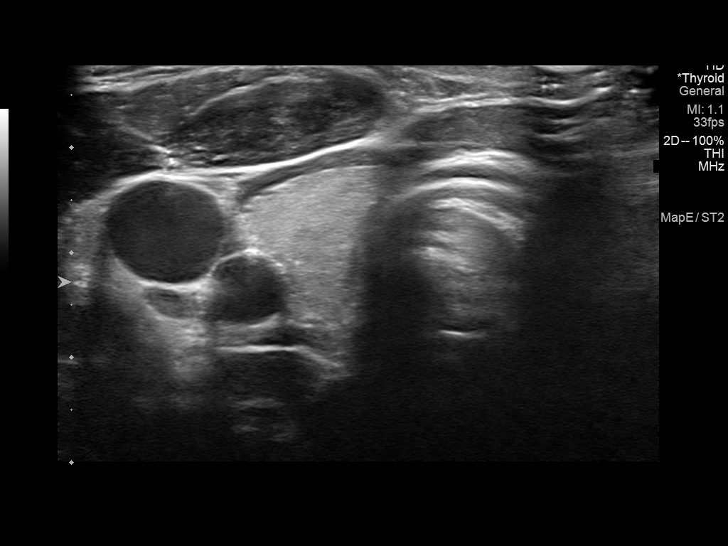
[im 11/44]
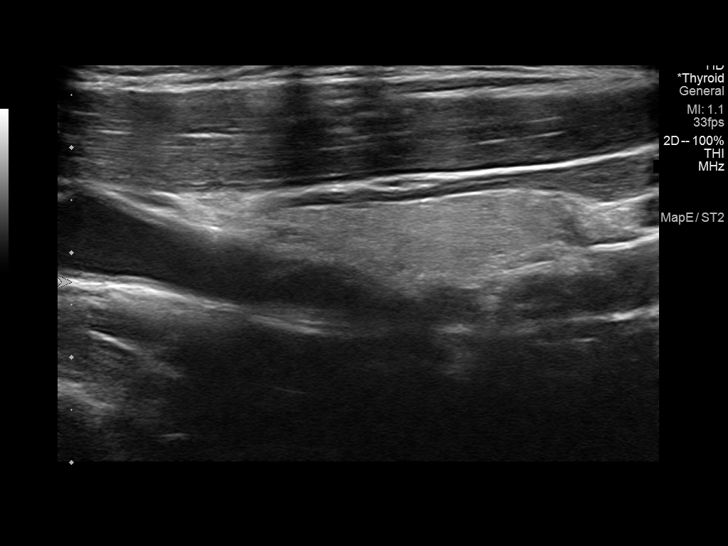
[im 15/44]
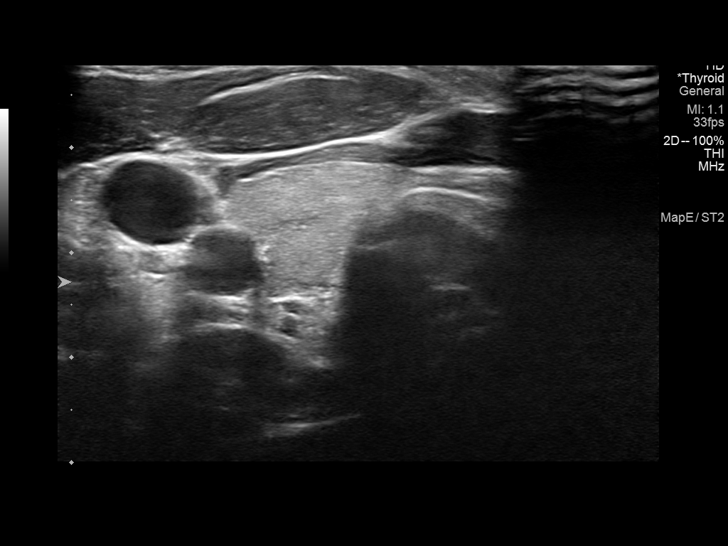
[im 17/44]
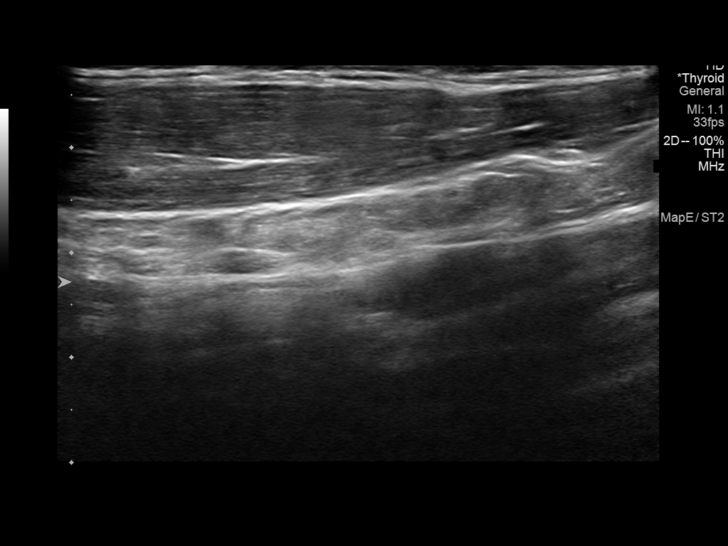
[im 20/44]
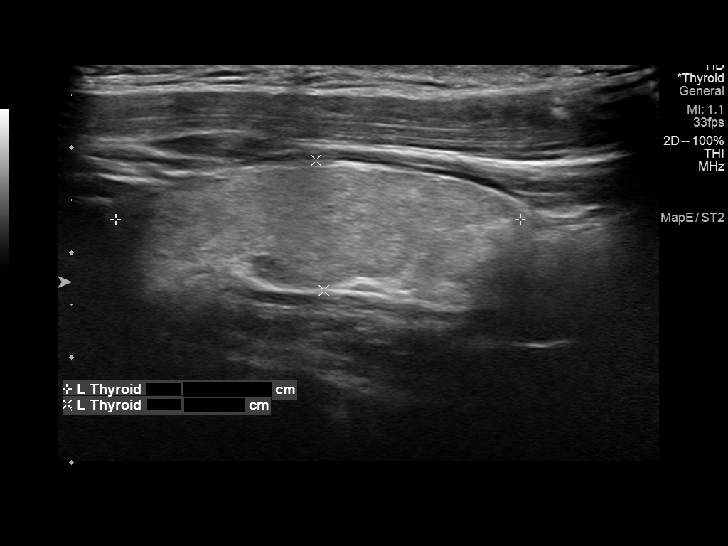
[im 24/44]
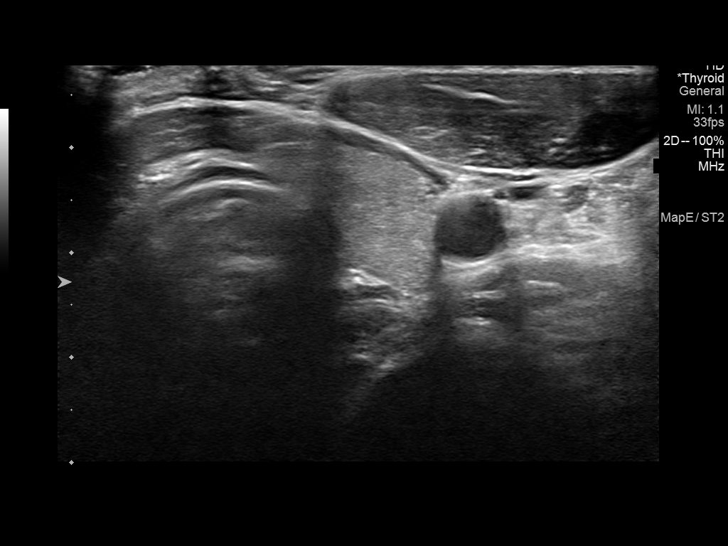
[im 27/44]
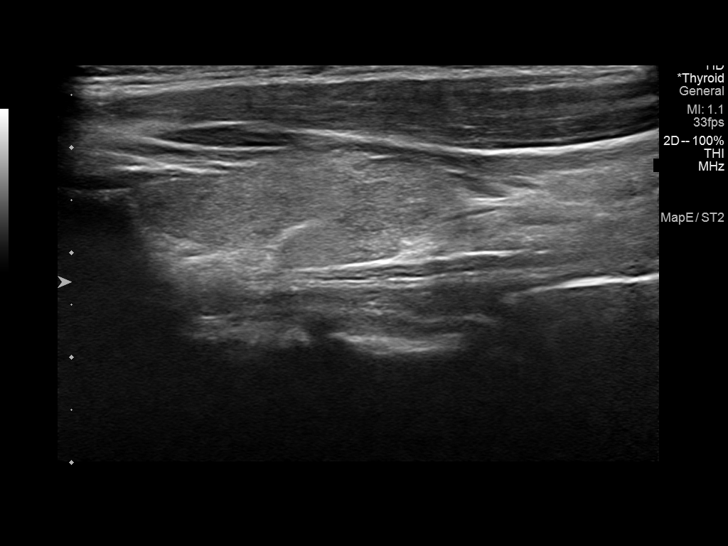
[im 29/44]
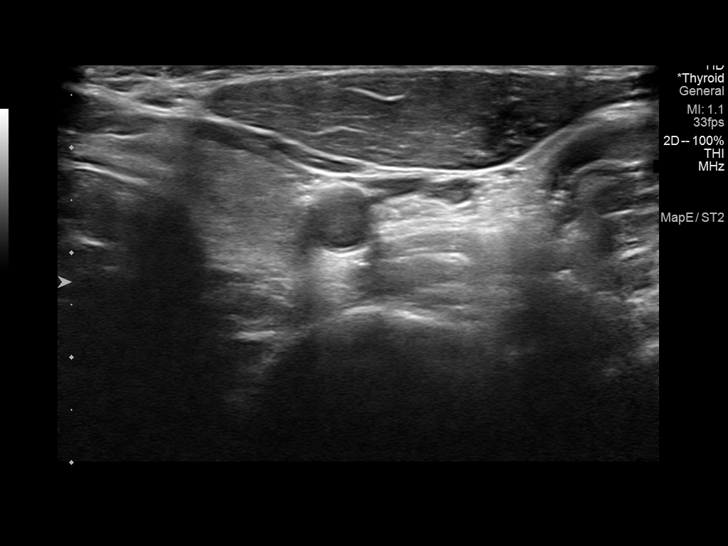
[im 33/44]
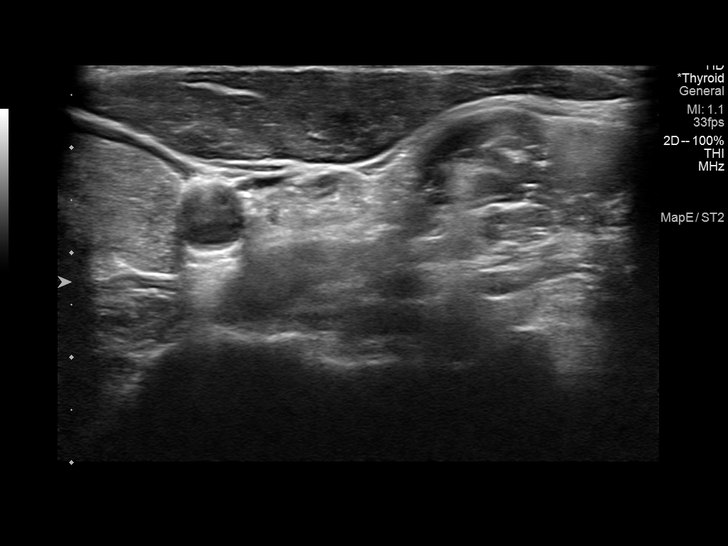
[im 36/44]
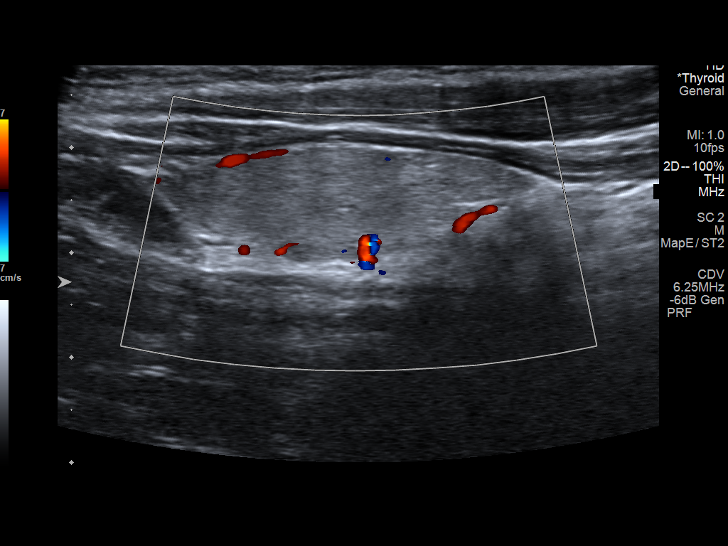
[im 40/44]
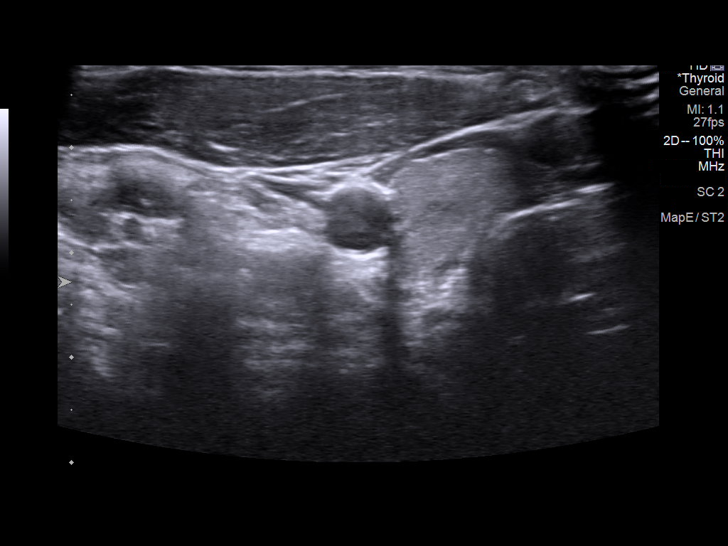
[im 44/44]
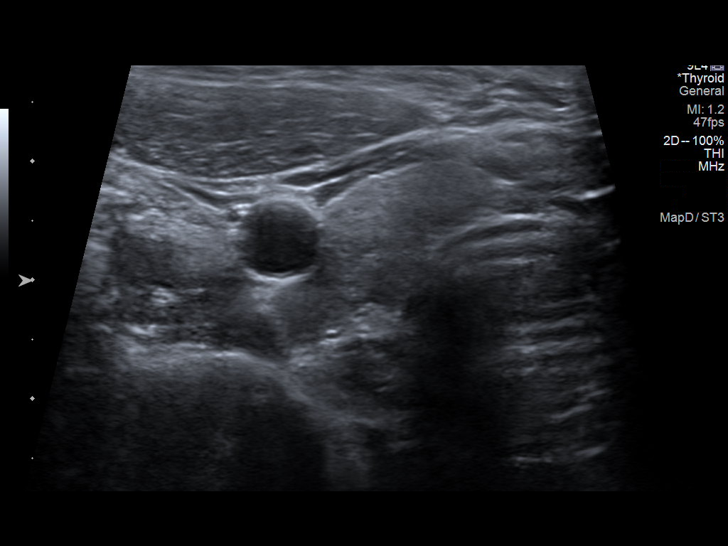

[14 of 25 positions shown; findings below may reference images not displayed]

FINDINGS: Parenchymal Echotexture: Mildly heterogenous

Isthmus: Surgically absent

Right lobe: 3.9 x 1.6 x 1.2 cm

Left lobe: 3.9 x 1.2 x 1.2 cm

_________________________________________________________

Estimated total number of nodules >/= 1 cm: 0

Number of spongiform nodules >/=  2 cm not described below (TR1): 0

Number of mixed cystic and solid nodules >/= 1.5 cm not described
below (TR2): 0

_________________________________________________________

No discrete nodules are seen within the thyroid gland.
IMPRESSION: Stable exam with a surgically absent isthmus. No new thyroid nodule
detected on today's study. No concerning pathologically enlarged
cervical lymph nodes.

The above is in keeping with the ACR TI-RADS recommendations - [HOSPITAL] 0278;[DATE].

## 2022-07-24 ENCOUNTER — Ambulatory Visit: Payer: BC Managed Care – PPO | Admitting: Nurse Practitioner

## 2022-07-25 ENCOUNTER — Ambulatory Visit: Payer: 59 | Admitting: Nurse Practitioner

## 2022-07-25 ENCOUNTER — Encounter: Payer: Self-pay | Admitting: Nurse Practitioner

## 2022-07-25 VITALS — BP 110/66 | HR 75 | Temp 98.2°F | Ht 65.0 in | Wt 147.0 lb

## 2022-07-25 DIAGNOSIS — J0110 Acute frontal sinusitis, unspecified: Secondary | ICD-10-CM | POA: Diagnosis not present

## 2022-07-25 MED ORDER — IPRATROPIUM BROMIDE 0.03 % NA SOLN: 2.0000 | Freq: Two times a day (BID) | NASAL | 12 refills | Status: DC

## 2022-07-25 MED ORDER — AMOXICILLIN-POT CLAVULANATE 875-125 MG PO TABS: 1.0000 | ORAL_TABLET | Freq: Two times a day (BID) | ORAL | 0 refills | Status: DC

## 2022-07-25 NOTE — Progress Notes (Signed)
Established Patient Office Visit  Subjective:  Patient ID: Crystal Humphrey, female    DOB: 1986-04-21  Age: 37 y.o. MRN: [id number]  CC:  Chief Complaint  Patient presents with   Cough    Cough, mild sinus congestion that moved to her chest, headache and teeth hurting for about a week and a half. Has not tested for covid. No fever. Has been taking ibuprofen, mucinex, tylenol and allergy medication.     HPI  Crystal Humphrey presents for sinus pressure. SUBJECTIVE:  Crystal Humphrey is a 37 y.o. female who complains of coryza, congestion, post nasal drip, headache and top teeth hurting and cough described as nocturnal for almost 2 weeks She denies a history of chest pain, fevers, shortness of breath, vomiting, weight loss, and wheezing and denies a history of asthma. Patient denies smoke cigarettes. She is using ibuprofen/Tylenol, Mucinex and antihistamine without significant improvement    HPI   Past Medical History:  Diagnosis Date   Allergy    Cancer ([place])    thyroid   Headache    Heart palpitations    PONV (postoperative nausea and vomiting)    Thyroid nodule     Past Surgical History:  Procedure Laterality Date   biopsy on thyroid     SKIN CYST REMOVED  AGE 35   THYROIDECTOMY N/A 03/07/2016   Procedure: THYROID ISTHMUSECTOMY;  Surgeon: Armandina Gemma, MD;  Location: [facility];  Service: General;  Laterality: N/A;   WISDOM TOOTH EXTRACTION  AGE 37    Family History  Problem Relation Age of Onset   Hyperlipidemia Father    Breast cancer Paternal Aunt    Cancer Maternal Grandmother        Breast   Arthritis Maternal Grandfather    Diabetes Paternal Grandfather    Liver disease Paternal Grandfather     Social History   Socioeconomic History   Marital status: Married    Spouse name: Not on file   Number of children: Not on file   Years of education: Not on file   Highest education level: Not on file  Occupational History   Not on file  Tobacco Use   Smoking  status: Never   Smokeless tobacco: Never  Vaping Use   Vaping Use: Never used  Substance and Sexual Activity   Alcohol use: No   Drug use: No   Sexual activity: Yes  Other Topics Concern   Not on file  Social History Narrative   65 year old boy, expecting 03/2017      Music teacher- Product manager      Social Determinants of Health   Financial Resource Strain: Not on file  Food Insecurity: Not on file  Transportation Needs: Not on file  Physical Activity: Not on file  Stress: Not on file  Social Connections: Not on file  Intimate Partner Violence: Not on file     Outpatient Medications Prior to Visit  Medication Sig Dispense Refill   acetaminophen (TYLENOL) 325 MG tablet Take 2 tablets (650 mg total) by mouth every 4 (four) hours as needed (for pain scale < 4). 30 tablet 0   cetirizine (ZYRTEC) 10 MG tablet Take 10 mg by mouth as needed for allergies.     ibuprofen (ADVIL) 600 MG tablet Take 1 tablet (600 mg total) by mouth every 6 (six) hours. 30 tablet 0   Multiple Vitamin (MULTIVITAMIN WITH MINERALS) TABS tablet Take 1 tablet by mouth daily.     norethindrone (MICRONOR) 0.35 MG tablet  Take 1 tablet by mouth daily.     amoxicillin-clavulanate (AUGMENTIN) 875-125 MG tablet Take 1 tablet by mouth 2 (two) times daily. 20 tablet 0   No facility-administered medications prior to visit.    No Known Allergies  ROS Review of Systems  Constitutional:  Negative for appetite change and fever.  HENT:  Positive for congestion and sinus pressure.        Teeth hurting  Respiratory:  Positive for cough.   Cardiovascular: Negative.   Neurological: Negative.   Psychiatric/Behavioral: Negative.        Objective:    Physical Exam Constitutional:      Appearance: Normal appearance.  HENT:     Right Ear: Tympanic membrane normal.     Left Ear: Tympanic membrane normal.     Nose: Congestion present.     Right Sinus: Frontal sinus tenderness present. No maxillary sinus  tenderness.     Left Sinus: Frontal sinus tenderness present. No maxillary sinus tenderness.     Mouth/Throat:     Mouth: Mucous membranes are moist.  Neurological:     Mental Status: She is alert.     BP 110/66 (BP Location: Left Arm, Patient Position: Sitting, Cuff Size: Normal)   Pulse 75   Temp 98.2 F (36.8 C) (Oral)   Ht 5\' 5"  (1.651 m)   Wt 147 lb (66.7 kg)   SpO2 97%   BMI 24.46 kg/m  Wt Readings from Last 3 Encounters:  07/25/22 147 lb (66.7 kg)  04/09/21 156 lb 6.4 oz (70.9 kg)  03/14/21 156 lb (70.8 kg)     Health Maintenance  Topic Date Due   Hepatitis C Screening  Never done   PAP SMEAR-Modifier  Never done   COVID-19 Vaccine (1) 08/10/2022 (Originally 08/[birth year]/1991)   INFLUENZA VACCINE  12/04/2022   DTaP/Tdap/Td (2 - Td or Tdap) 09/26/2030   HIV Screening  Completed   HPV VACCINES  Aged Out    There are no preventive care reminders to display for this patient.  Lab Results  Component Value Date   TSH 2.44 04/15/2019   Lab Results  Component Value Date   WBC 12.6 (H) 12/27/2020   HGB 10.3 (L) 12/27/2020   HCT 30.8 (L) 12/27/2020   MCV 95.4 12/27/2020   PLT 235 12/27/2020   Lab Results  Component Value Date   NA 135 04/15/2019   K 3.9 04/15/2019   CO2 22 04/15/2019   GLUCOSE 107 (H) 04/15/2019   BUN 14 04/15/2019   CREATININE 0.89 04/15/2019   BILITOT 0.3 04/15/2019   AST 14 04/15/2019   ALT 10 04/15/2019   PROT 6.8 04/15/2019   CALCIUM 9.0 04/15/2019   No results found for: "CHOL" No results found for: "HDL" No results found for: "LDLCALC" No results found for: "TRIG" No results found for: "CHOLHDL" No results found for: "HGBA1C"    Assessment & Plan:  Acute non-recurrent frontal sinusitis Assessment & Plan: Started her on Augmentin and Atrovent nasal spray. Advised to increase fluid intake and use steam/humidifier. If symptoms do not improve please call the office for further evaluation.   Other orders -     Amoxicillin-Pot  Clavulanate; Take 1 tablet by mouth 2 (two) times daily.  Dispense: 20 tablet; Refill: 0 -     Ipratropium Bromide; Place 2 sprays into both nostrils every 12 (twelve) hours.  Dispense: 30 mL; Refill: 12    Follow-up: Return if symptoms worsen or fail to improve.   Theresia Lo,  NP

## 2022-07-25 NOTE — Patient Instructions (Addendum)
Rx sent to pharmacy. Increase fluid in take. Use steam or humidifier.

## 2022-07-28 ENCOUNTER — Encounter: Payer: Self-pay | Admitting: Nurse Practitioner

## 2022-08-04 NOTE — Assessment & Plan Note (Signed)
Started her on Augmentin and Atrovent nasal spray. Advised to increase fluid intake and use steam/humidifier. If symptoms do not improve please call the office for further evaluation.

## 2022-08-16 DIAGNOSIS — J0300 Acute streptococcal tonsillitis, unspecified: Secondary | ICD-10-CM | POA: Diagnosis not present

## 2022-12-23 ENCOUNTER — Telehealth: Payer: Self-pay | Admitting: Family

## 2022-12-23 DIAGNOSIS — Z7689 Persons encountering health services in other specified circumstances: Secondary | ICD-10-CM

## 2022-12-23 DIAGNOSIS — Z1283 Encounter for screening for malignant neoplasm of skin: Secondary | ICD-10-CM

## 2022-12-23 NOTE — Telephone Encounter (Signed)
Pt called in stating if Arnett can do a dermatology referral for her? Any questions or concern, she's available [phone].

## 2022-12-24 NOTE — Telephone Encounter (Signed)
LVM to call back to inform pt that Claris Che is out of the office until next week but will give her message when she comes back

## 2022-12-30 NOTE — Addendum Note (Signed)
Addended by: Sandy Salaam on: 12/30/2022 04:19 PM   Modules accepted: Orders

## 2022-12-30 NOTE — Telephone Encounter (Signed)
Patient states she is returning our call.  Patient states she would like to be referred to [facility].

## 2022-12-30 NOTE — Telephone Encounter (Signed)
Referral has been placed. 

## 2022-12-30 NOTE — Telephone Encounter (Signed)
Please place ref to dermatology and let pt know new pts are 6 months at least out  Reason for referral est care, skin exam  Pt can also call [facility] or [facility] in town as most of the time referrals were not required

## 2022-12-30 NOTE — Telephone Encounter (Signed)
LMTCB

## 2023-04-27 DIAGNOSIS — Z01419 Encounter for gynecological examination (general) (routine) without abnormal findings: Secondary | ICD-10-CM | POA: Diagnosis not present

## 2023-04-27 DIAGNOSIS — Z6824 Body mass index (BMI) 24.0-24.9, adult: Secondary | ICD-10-CM | POA: Diagnosis not present

## 2023-05-18 DIAGNOSIS — Z30430 Encounter for insertion of intrauterine contraceptive device: Secondary | ICD-10-CM | POA: Diagnosis not present

## 2023-05-18 DIAGNOSIS — Z3202 Encounter for pregnancy test, result negative: Secondary | ICD-10-CM | POA: Diagnosis not present

## 2023-07-16 IMAGING — US US THYROID
1 series · 14 of 25 positions shown · non-contrast
Comparison: 01/11/2020

CLINICAL DATA: Thyroid nodule

Prior resection of the isthmus.
EXAM:
THYROID ULTRASOUND
TECHNIQUE: Ultrasound examination of the thyroid gland and adjacent soft
tissues was performed.

[Series 1: us thyroid · 0.07mm/px · 14 of 61 slices shown]
[im 1/61]
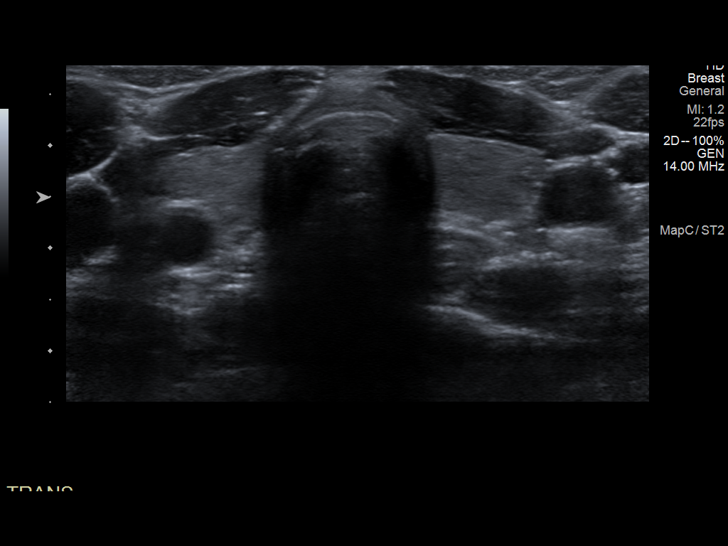
[im 6/61]
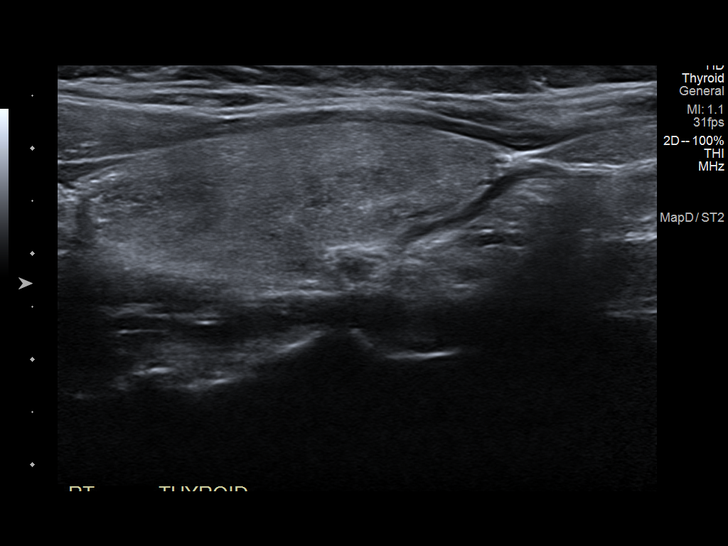
[im 11/61]
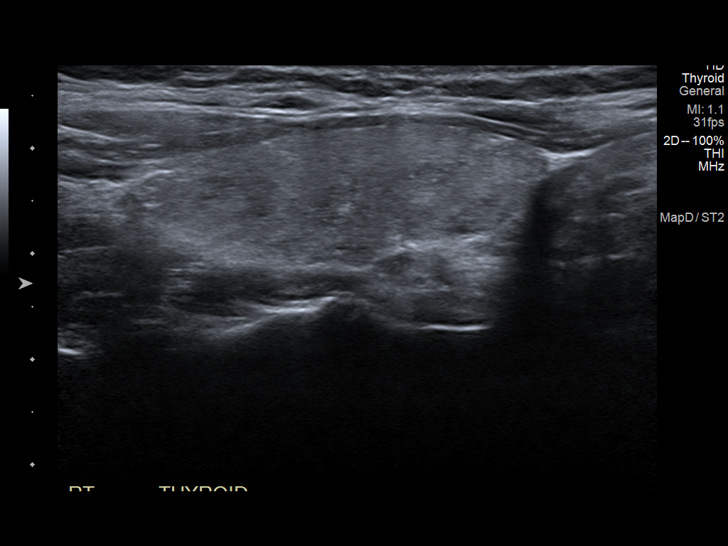
[im 16/61]
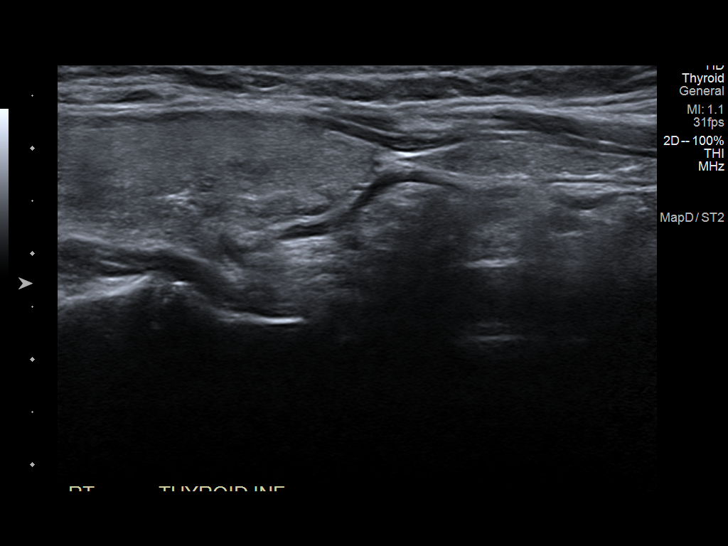
[im 21/61]
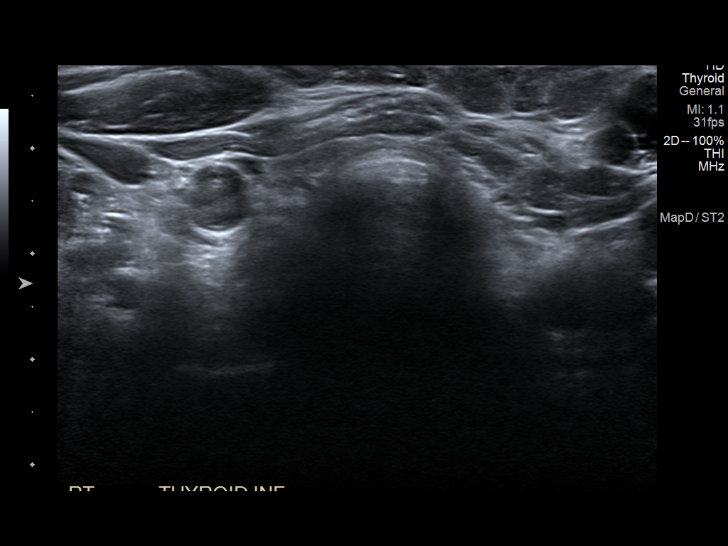
[im 23/61]
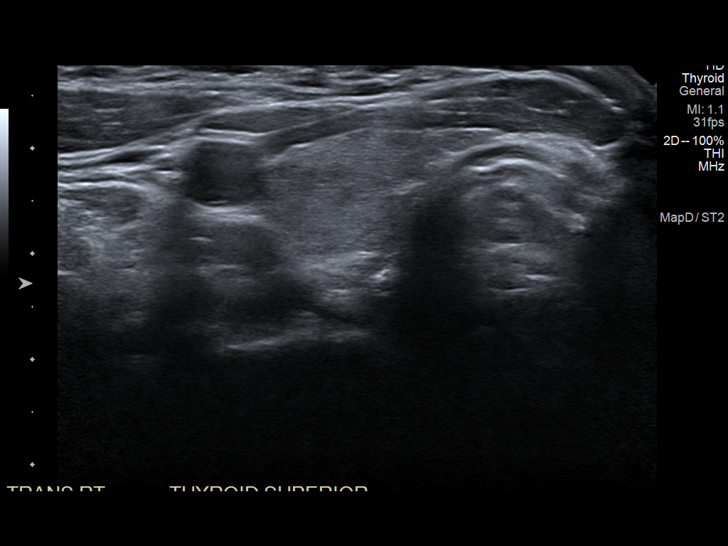
[im 28/61]
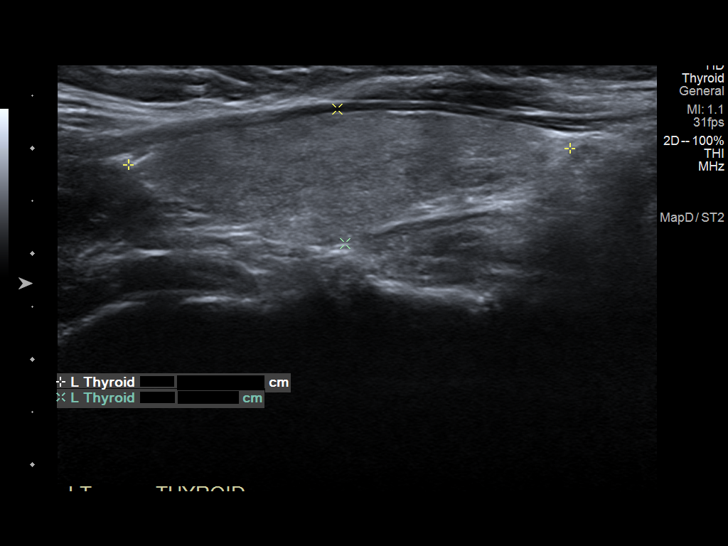
[im 33/61]
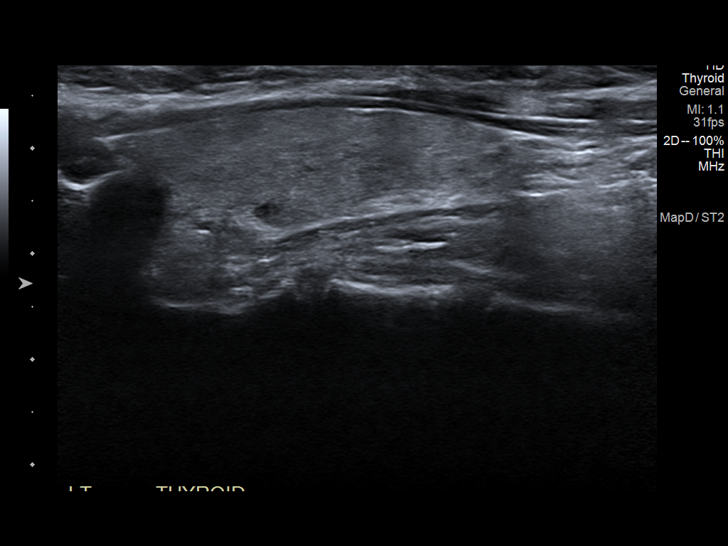
[im 38/61]
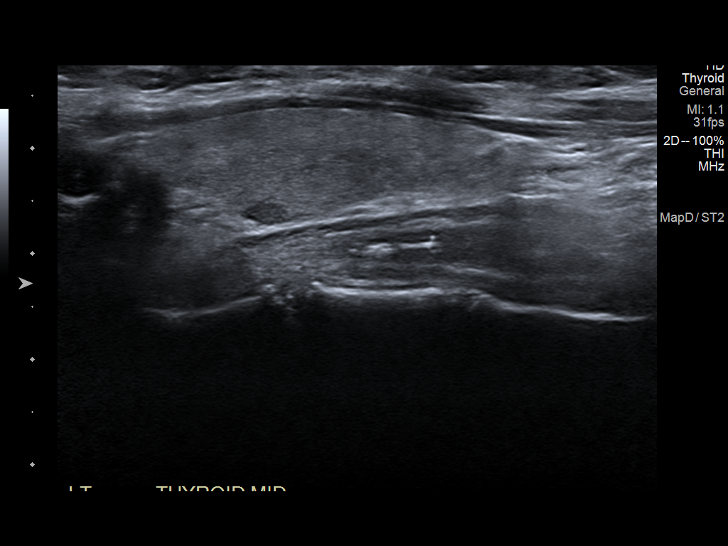
[im 41/61]
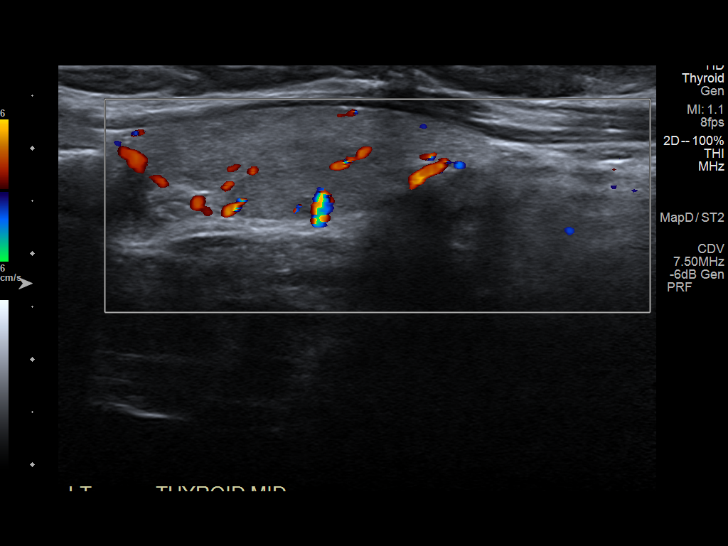
[im 46/61]
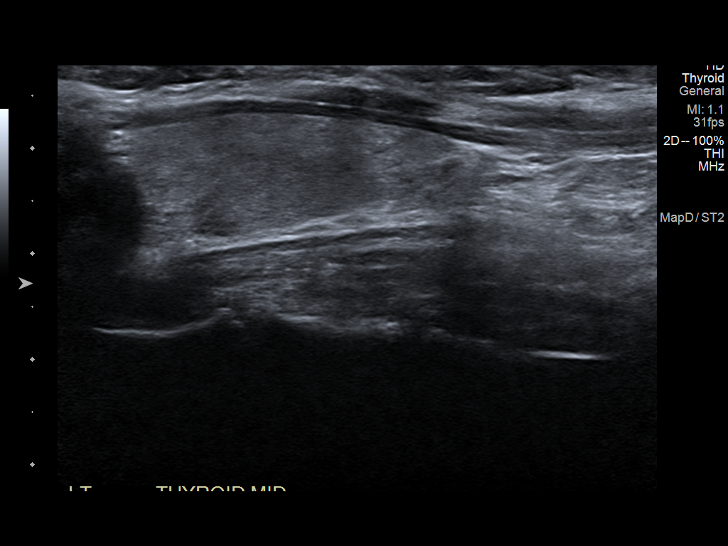
[im 51/61]
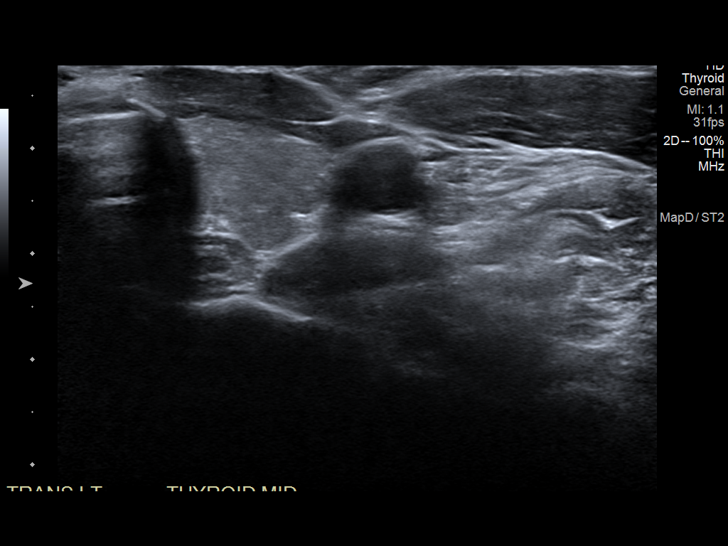
[im 56/61]
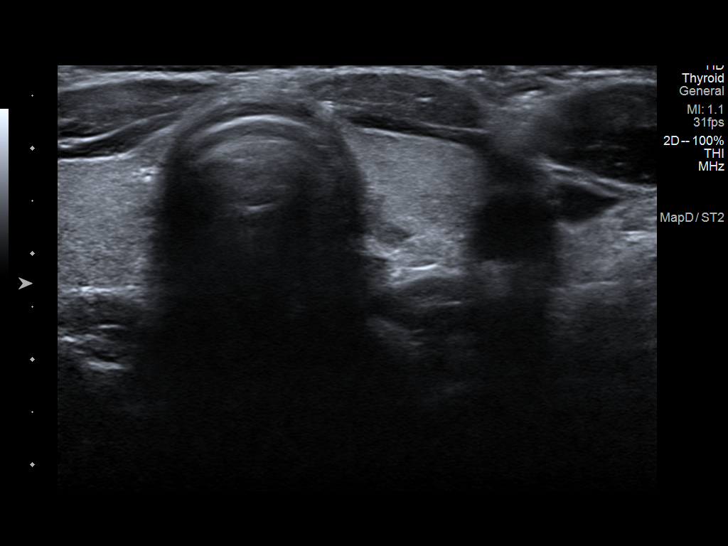
[im 61/61]
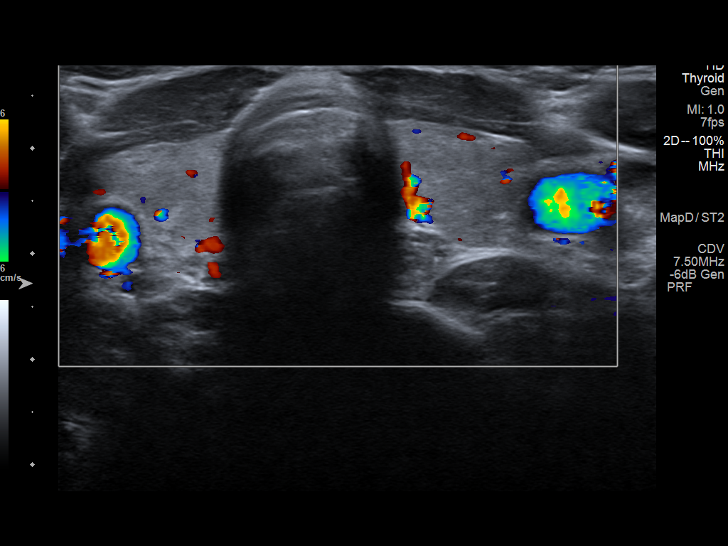

[14 of 25 positions shown; findings below may reference images not displayed]

FINDINGS: Parenchymal Echotexture: Mildly heterogenous

Isthmus: Surgically absent

Right lobe: 4.1 x 1.5 x 1.6 cm

Left lobe: 4.5 x 1.3 x 1.2 cm

_________________________________________________________

Estimated total number of nodules >/= 1 cm: 0

Number of spongiform nodules >/=  2 cm not described below (TR1): 0

Number of mixed cystic and solid nodules >/= 1.5 cm not described
below (TR2): 0

_________________________________________________________

4 mm left mid thyroid nodule does not meet criteria for FNA or
imaging follow-up.
IMPRESSION: 1. No significant sonographic abnormality of the thyroid.
2. Postsurgical changes of surgical resection of the isthmus. No
evidence of recurrence in the surgical bed.

The above is in keeping with the ACR TI-RADS recommendations - [HOSPITAL] 3818;[DATE].

## 2023-07-24 DIAGNOSIS — Z30431 Encounter for routine checking of intrauterine contraceptive device: Secondary | ICD-10-CM | POA: Diagnosis not present

## 2024-01-18 ENCOUNTER — Ambulatory Visit: Payer: 59 | Admitting: Dermatology

## 2024-01-18 DIAGNOSIS — Z1283 Encounter for screening for malignant neoplasm of skin: Secondary | ICD-10-CM

## 2024-01-18 DIAGNOSIS — D225 Melanocytic nevi of trunk: Secondary | ICD-10-CM

## 2024-01-18 DIAGNOSIS — L821 Other seborrheic keratosis: Secondary | ICD-10-CM

## 2024-01-18 DIAGNOSIS — D2372 Other benign neoplasm of skin of left lower limb, including hip: Secondary | ICD-10-CM

## 2024-01-18 DIAGNOSIS — L918 Other hypertrophic disorders of the skin: Secondary | ICD-10-CM

## 2024-01-18 DIAGNOSIS — L720 Epidermal cyst: Secondary | ICD-10-CM

## 2024-01-18 DIAGNOSIS — L814 Other melanin hyperpigmentation: Secondary | ICD-10-CM

## 2024-01-18 DIAGNOSIS — L0591 Pilonidal cyst without abscess: Secondary | ICD-10-CM

## 2024-01-18 DIAGNOSIS — D220 Melanocytic nevi of lip: Secondary | ICD-10-CM

## 2024-01-18 DIAGNOSIS — D239 Other benign neoplasm of skin, unspecified: Secondary | ICD-10-CM

## 2024-01-18 DIAGNOSIS — D229 Melanocytic nevi, unspecified: Secondary | ICD-10-CM

## 2024-01-18 DIAGNOSIS — D1801 Hemangioma of skin and subcutaneous tissue: Secondary | ICD-10-CM

## 2024-01-18 NOTE — Progress Notes (Signed)
 New Patient Visit   Subjective  Crystal Humphrey is a 38 y.o. female who presents for the following: Skin Cancer Screening and Full Body Skin Exam  The patient presents for Total-Body Skin Exam (TBSE) for skin cancer screening and mole check. The patient has spots, moles and lesions to be evaluated, some may be new or changing. She has a spot on her left posterior leg x 4-5 years, possibly changing some. She also has skin tags on the left arm. She also has a possible cyst at the lower back/sacral area that comes and goes- currently gone. No history of skin cancer or abnormal moles.     The following portions of the chart were reviewed this encounter and updated as appropriate: medications, allergies, medical history  Review of Systems:  No other skin or systemic complaints except as noted in HPI or Assessment and Plan.  Objective  Well appearing patient in no apparent distress; mood and affect are within normal limits.  A full examination was performed including scalp, head, eyes, ears, nose, lips, neck, chest, axillae, abdomen, back, buttocks, bilateral upper extremities, bilateral lower extremities, hands, feet, fingers, toes, fingernails, and toenails. All findings within normal limits unless otherwise noted below.   Relevant physical exam findings are noted in the Assessment and Plan.    Assessment & Plan   SKIN CANCER SCREENING PERFORMED TODAY.   LENTIGINES, SEBORRHEIC KERATOSES, HEMANGIOMAS - Benign normal skin lesions - Benign-appearing - Call for any changes  MELANOCYTIC NEVI - Tan-brown and/or pink-flesh-colored symmetric macules and papules - Left clavicle 9 mm fleshy brown papule - Right medial breast 4 mm fleshy tan papule - Left post axilla 2.5 mm thin brown papule with notch - Right upper lip at vermilion 3 mm light tan papule - Benign appearing on exam today - Observation - Call clinic for new or changing moles - Recommend daily use of broad spectrum spf 30+  sunscreen to sun-exposed areas.   DERMATOFIBROMA Exam: Firm pink/brown papulenodule with dimple sign at left popliteal.  Treatment Plan: A dermatofibroma is a benign growth possibly related to trauma, such as an insect bite, cut from shaving, or inflamed acne-type bump.  Treatment options to remove include shave or excision with resulting scar and risk of recurrence.  Since benign-appearing and not bothersome, will observe for now.   Milia - tiny firm white papules - type of cyst - benign - sometimes these will clear with nightly OTC adapalene/Differin 0.1% gel or retinol. - may be extracted if symptomatic - observe - Samples of Neutrogena Water Cream  PILONIDAL CYST, PROBABLE Exam:  Clear today at superior gluteal cleft  Treatment Plan: Benign-appearing. History most consistent with a pilonidal cyst. Discussed that these can grow over time and sometimes sometimes get irritated or inflamed. Discussed pilonidal cysts often have a sinus tract that can go deeper. Recommend evaluation with general surgery for possible excision if it becomes bothersome. Recommend repeat evaluation if it changes.  Patient defers referral at this time, but will contact us  for referral to general surgeon when flared.   Acrochordons (Skin Tags) - Fleshy, skin-colored pedunculated papules at axilla - Benign appearing.  - Observe. - If desired, they can be removed with an in office procedure that is not covered by insurance. - Please call the clinic if you notice any new or changing lesions.   Return 1-2 years, for TBSE.  LILLETTE Andrea Kerns, CMA, am acting as scribe for Rexene Rattler, MD .   Documentation: I have reviewed the  above documentation for accuracy and completeness, and I agree with the above.  Rexene Rattler, MD

## 2024-01-18 NOTE — Patient Instructions (Addendum)
 Skin tag removal generally is considered cosmetic and not covered by insurance.  Cosmetic removal fee is $115 for up to 15 tags removed. You can contact your insurance to see if skin tag removal is a medically necessary covered benefit.  Even if covered, copay and deductible payments would apply. CPT code: 88799 Diagnosis code: L91.8   Recommend OTC adapalene 0.1% gel pea sized amount to entire face nightly as tolerated.  This can be used to treat acne (whiteheads, blackheads) and milia (tiny firm white cysts).  It may cause dry irritated skin with initial use, and to minimize this, we recommend applying a light moisturizer to face before applying adapalene and/or applying it less frequently.  OTC brands include Differin 0.1% gel (Galderma), Adapalene 0.1% gel (Neutrogena), and Effaclar gel ( La Roche Posay).  They are found in the acne section on the pharmacy.  A dermatofibroma is a benign growth possibly related to trauma, such as an insect bite, cut from shaving, or inflamed acne-type bump.  Treatment options to remove include shave or excision with resulting scar and risk of recurrence.  Since not bothersome, will observe for now.   Due to recent changes in healthcare laws, you may see results of your pathology and/or laboratory studies on MyChart before the doctors have had a chance to review them. We understand that in some cases there may be results that are confusing or concerning to you. Please understand that not all results are received at the same time and often the doctors may need to interpret multiple results in order to provide you with the best plan of care or course of treatment. Therefore, we ask that you please give us  2 business days to thoroughly review all your results before contacting the office for clarification. Should we see a critical lab result, you will be contacted sooner.   If You Need Anything After Your Visit  If you have any questions or concerns for your doctor,  please call our main line at [phone] and press option 4 to reach your doctor's medical assistant. If no one answers, please leave a voicemail as directed and we will return your call as soon as possible. Messages left after 4 pm will be answered the following business day.   You may also send us  a message via MyChart. We typically respond to MyChart messages within 1-2 business days.  For prescription refills, please ask your pharmacy to contact our office. Our fax number is [phone].  If you have an urgent issue when the clinic is closed that cannot wait until the next business day, you can page your doctor at the number below.    Please note that while we do our best to be available for urgent issues outside of office hours, we are not available 24/7.   If you have an urgent issue and are unable to reach us , you may choose to seek medical care at your doctor's office, retail clinic, urgent care center, or emergency room.  If you have a medical emergency, please immediately call 911 or go to the emergency department.  Pager Numbers  - Dr. Hester: [phone]  - Dr. Jackquline: [phone]  - Dr. Claudene: [phone]   - Dr. Raymund: [phone]  In the event of inclement weather, please call our main line at [phone] for an update on the status of any delays or closures.  Dermatology Medication Tips: Please keep the boxes that topical medications come in in order to help keep track of the instructions  about where and how to use these. Pharmacies typically print the medication instructions only on the boxes and not directly on the medication tubes.   If your medication is too expensive, please contact our office at [phone] option 4 or send us  a message through MyChart.   We are unable to tell what your co-pay for medications will be in advance as this is different depending on your insurance coverage. However, we may be able to find a substitute medication at lower  cost or fill out paperwork to get insurance to cover a needed medication.   If a prior authorization is required to get your medication covered by your insurance company, please allow us  1-2 business days to complete this process.  Drug prices often vary depending on where the prescription is filled and some pharmacies may offer cheaper prices.  The website www.goodrx.com contains coupons for medications through different pharmacies. The prices here do not account for what the cost may be with help from insurance (it may be cheaper with your insurance), but the website can give you the price if you did not use any insurance.  - You can print the associated coupon and take it with your prescription to the pharmacy.  - You may also stop by our office during regular business hours and pick up a GoodRx coupon card.  - If you need your prescription sent electronically to a different pharmacy, notify our office through [facility] or by phone at [phone] option 4.     Si Usted Necesita Algo Despus de Su Visita  Tambin puede enviarnos un mensaje a travs de Clinical cytogeneticist. Por lo general respondemos a los mensajes de MyChart en el transcurso de 1 a 2 das hbiles.  Para renovar recetas, por favor pida a su farmacia que se ponga en contacto con nuestra oficina. Randi lakes de fax es [place] [phone].  Si tiene un asunto urgente cuando la clnica est cerrada y que no puede esperar hasta el siguiente da hbil, puede llamar/localizar a su doctor(a) al nmero que aparece a continuacin.   Por favor, tenga en cuenta que aunque hacemos todo lo posible para estar disponibles para asuntos urgentes fuera del horario de [place], no estamos disponibles las 24 horas del da, los 7 [address]  [address] de la [place].   Si tiene un problema urgente y no puede comunicarse con nosotros, puede optar por buscar atencin mdica  en el consultorio de su doctor(a), en una clnica privada, en un centro de atencin urgente o en  una sala de emergencias.  Si tiene Engineer, drilling, por favor llame inmediatamente al 911 o vaya a la sala de emergencias.  Nmeros de bper  - Dr. Hester: [phone]  - Dra. Jackquline: [phone]  - Dr. Claudene: [phone]  - Dra. Kitts: [phone]  En caso de inclemencias del [place], por favor llame a nuestra lnea principal al [phone] para una actualizacin sobre el estado de cualquier retraso o cierre.  Consejos para la medicacin en dermatologa: Por favor, guarde las cajas en las que vienen los medicamentos de uso tpico para ayudarle a seguir las instrucciones sobre dnde y cmo usarlos. Las farmacias generalmente imprimen las instrucciones del medicamento slo en las cajas y no directamente en los tubos del [place].   Si su medicamento es muy caro, por favor, pngase en contacto con landry rieger llamando al [phone] y presione la opcin 4 o envenos un mensaje a travs de Clinical cytogeneticist.   No podemos decirle cul ser su copago por los  medicamentos por adelantado ya que esto es diferente dependiendo de la cobertura de su seguro. Sin embargo, es posible que podamos encontrar un medicamento sustituto a Audiological scientist un formulario para que el seguro cubra el medicamento que se considera necesario.   Si se requiere una autorizacin previa para que su compaa de seguros malta su medicamento, por favor permtanos de 1 a 2 das hbiles para completar este proceso.  Los precios de los medicamentos varan con frecuencia dependiendo del Environmental consultant de dnde se surte la receta y alguna farmacias pueden ofrecer precios ms baratos.  El sitio web www.goodrx.com tiene cupones para medicamentos de Health and safety inspector. Los precios aqu no tienen en cuenta lo que podra costar con la ayuda del seguro (puede ser ms barato con su seguro), pero el sitio web puede darle el precio si no utiliz Tourist information centre manager.  - Puede imprimir el cupn correspondiente y llevarlo con su receta a  la farmacia.  - Tambin puede pasar por nuestra oficina durante el horario de atencin regular y Education officer, museum una tarjeta de cupones de GoodRx.  - Si necesita que su receta se enve electrnicamente a una farmacia diferente, informe a nuestra oficina a travs de MyChart de [facility] o por telfono llamando al [phone] y presione la opcin 4.

## 2025-07-25 ENCOUNTER — Encounter: Admitting: Dermatology
